# Patient Record
Sex: Female | Born: 1980
Health system: Southern US, Community
[De-identification: ages and names within clinical notes are randomized; demographics above are authoritative.]

## PROBLEM LIST (undated history)

## (undated) ENCOUNTER — Inpatient Hospital Stay (HOSPITAL_COMMUNITY): Payer: Self-pay

## (undated) DIAGNOSIS — M1990 Unspecified osteoarthritis, unspecified site: Secondary | ICD-10-CM

## (undated) DIAGNOSIS — Z5189 Encounter for other specified aftercare: Secondary | ICD-10-CM

## (undated) DIAGNOSIS — D649 Anemia, unspecified: Secondary | ICD-10-CM

## (undated) DIAGNOSIS — J302 Other seasonal allergic rhinitis: Secondary | ICD-10-CM

## (undated) HISTORY — PX: DILATION AND CURETTAGE OF UTERUS: SHX78

## (undated) HISTORY — DX: Unspecified osteoarthritis, unspecified site: M19.90

## (undated) HISTORY — DX: Encounter for other specified aftercare: Z51.89

## (undated) HISTORY — PX: DILATION AND EVACUATION: SHX1459

---

## 1999-10-07 ENCOUNTER — Emergency Department (HOSPITAL_COMMUNITY): Admission: EM | Admit: 1999-10-07 | Discharge: 1999-10-07 | Payer: Self-pay

## 2000-07-26 ENCOUNTER — Other Ambulatory Visit: Admission: RE | Admit: 2000-07-26 | Discharge: 2000-07-26 | Payer: Self-pay | Admitting: Internal Medicine

## 2001-05-11 ENCOUNTER — Ambulatory Visit (HOSPITAL_COMMUNITY): Admission: RE | Admit: 2001-05-11 | Discharge: 2001-05-11 | Payer: Self-pay

## 2008-03-04 ENCOUNTER — Emergency Department: Payer: Self-pay | Admitting: Emergency Medicine

## 2008-08-13 ENCOUNTER — Inpatient Hospital Stay (HOSPITAL_COMMUNITY): Admission: AD | Admit: 2008-08-13 | Discharge: 2008-08-15 | Payer: Self-pay | Admitting: Obstetrics and Gynecology

## 2009-08-18 ENCOUNTER — Encounter: Admission: RE | Admit: 2009-08-18 | Discharge: 2009-08-18 | Payer: Self-pay | Admitting: Family Medicine

## 2010-04-18 LAB — CBC
HCT: 31.5 % — ABNORMAL LOW (ref 36.0–46.0)
MCHC: 34.5 g/dL (ref 30.0–36.0)
MCV: 91.3 fL (ref 78.0–100.0)
MCV: 92.2 fL (ref 78.0–100.0)
Platelets: 186 10*3/uL (ref 150–400)
RBC: 3.18 MIL/uL — ABNORMAL LOW (ref 3.87–5.11)
RDW: 14 % (ref 11.5–15.5)
RDW: 14.1 % (ref 11.5–15.5)
WBC: 13.4 10*3/uL — ABNORMAL HIGH (ref 4.0–10.5)

## 2010-04-18 LAB — RPR: RPR Ser Ql: NONREACTIVE

## 2012-03-26 ENCOUNTER — Encounter (HOSPITAL_COMMUNITY): Payer: Self-pay | Admitting: *Deleted

## 2012-03-26 ENCOUNTER — Inpatient Hospital Stay (HOSPITAL_COMMUNITY)
Admission: AD | Admit: 2012-03-26 | Discharge: 2012-03-26 | Disposition: A | Discharge status: Home / Self Care | Payer: 59 | Source: Ambulatory Visit | Attending: Obstetrics & Gynecology | Admitting: Obstetrics & Gynecology

## 2012-03-26 DIAGNOSIS — O209 Hemorrhage in early pregnancy, unspecified: Secondary | ICD-10-CM

## 2012-03-26 DIAGNOSIS — O200 Threatened abortion: Secondary | ICD-10-CM | POA: Insufficient documentation

## 2012-03-26 HISTORY — DX: Anemia, unspecified: D64.9

## 2012-03-26 LAB — URINALYSIS, ROUTINE W REFLEX MICROSCOPIC
Bilirubin Urine: NEGATIVE
Glucose, UA: NEGATIVE mg/dL
Ketones, ur: NEGATIVE mg/dL
Leukocytes, UA: NEGATIVE
Nitrite: NEGATIVE
Protein, ur: NEGATIVE mg/dL
Specific Gravity, Urine: 1.025 (ref 1.005–1.030)
Urobilinogen, UA: 0.2 mg/dL (ref 0.0–1.0)
pH: 7 (ref 5.0–8.0)

## 2012-03-26 LAB — URINE MICROSCOPIC-ADD ON

## 2012-03-26 NOTE — MAU Note (Signed)
Pt presents with complaints of blood in her urine this morning and is just concerned. Denies pain states her stomach just feels weird at the bottom of her stomach

## 2012-03-26 NOTE — MAU Provider Note (Signed)
  History     CSN: [id number]  Arrival date and time: 03/26/12 1031   First Provider Initiated Contact with Patient 03/26/12 1100      Chief Complaint  Patient presents with  . Hematuria   HPI 32 yo, 8.6/7 wks by LMP, here for vaginal bleeding- large spot on tissue and a small clot this morning and some brown/pink spotting yesterday. 1st C/s in 2008, then VBAC in 2010. PNCare not started yet, but plans to start at [facility]/ Dr Billy Coast. O(+) per office records.    ROS Physical Exam   Blood pressure 132/68, pulse 80, temperature 98 F (36.7 C), height 5\' 6"  (1.676 m), weight 284 lb (128.822 kg), last menstrual period 01/24/2012.  Physical Exam A&O x 3, no acute distress. Pleasant HEENT neg Lungs CTA bilat CV RRR, S1S2 normal Abdo soft, non tender, non acute Extr no edema/ tenderness Pelvic Cervix closed, long. No CMT FHT Present on bedside sono (abdominal and vaginal sono)  MAU Course  Procedures  Bedside sono - abdo and vaginal -Intrauterine sac and fetal pole, yolk sac noted, FCA noted, normal in 160s.   Assessment and Plan  Threatened abortion. 8.6/7 by LMP. O(+) Reassured, Discharge home  Naveen Lorusso R 03/26/2012, 11:16 AM

## 2012-03-26 NOTE — MAU Note (Signed)
Pt. Here due to  bleeding this am after wiping. None noted in toilet after voiding. No pain associated with bleeding.

## 2012-06-07 ENCOUNTER — Encounter (HOSPITAL_COMMUNITY): Payer: Self-pay | Admitting: *Deleted

## 2012-06-07 ENCOUNTER — Other Ambulatory Visit: Payer: Self-pay | Admitting: Obstetrics and Gynecology

## 2012-06-07 NOTE — H&P (Signed)
NAME:  Melinda Chan, Melinda Chan NO.:  [id number]  MEDICAL RECORD NO.:  [id number]  LOCATION:  [facility]                         FACILITY:  [facility]  PHYSICIAN:  Lenoard Aden, M.D.DATE OF BIRTH:  05-13-1980  DATE OF ADMISSION:  06/07/2012 DATE OF DISCHARGE:                             HISTORY & PHYSICAL   CHIEF COMPLAINT:  IUFD at 18 weeks.  HISTORY OF PRESENT ILLNESS:  She is a 32 year old African American female, G4, P2, history of C-section x1, vaginal delivery x1, spontaneous abortion x1 who presents now with an IUFD unexplained at 18 weeks.  MEDICATIONS:  Prenatal vitamins and Tylenol as needed.  SOCIAL HISTORY:  She is a nonsmoker, nondrinker.  She denies domestic or physical violence.  She has a history of vaginal delivery x1, C-sections x1.  SURGICAL HISTORY:  Otherwise noncontributory.  PHYSICAL EXAMINATION:  GENERAL:  Well-developed well-nourished African American female in no acute distress. HEENT:  Normal. NECK:  Supple.  Full range of motion. LUNGS:  Clear. HEART:  Regular rhythm. ABDOMEN:  Soft, gravid, nontender.  Uterus 18 weeks' size.  No adnexal masses appreciated.  IMPRESSION:  Intrauterine fetal demise unexplained at 18 weeks.  PLAN:  Late trimester D and E.  Risks of anesthesia, infection, bleeding, injury to abdominal organs discussed.  Delayed versus immediate complications to include bowel and bladder injury noted.  The patient acknowledges and wishes to proceed.  Due to the patient's history of anemia, we will type and screen.     Lenoard Aden, M.D.     RJT/MEDQ  D:  06/07/2012  T:  06/07/2012  Job:  [id number]

## 2012-06-08 ENCOUNTER — Ambulatory Visit (HOSPITAL_COMMUNITY): Payer: 59

## 2012-06-08 ENCOUNTER — Encounter (HOSPITAL_COMMUNITY): Payer: Self-pay | Admitting: Anesthesiology

## 2012-06-08 ENCOUNTER — Ambulatory Visit (HOSPITAL_COMMUNITY)
Admission: AD | Admit: 2012-06-08 | Discharge: 2012-06-08 | Disposition: A | Discharge status: Home / Self Care | Payer: 59 | Source: Ambulatory Visit | Attending: Obstetrics and Gynecology | Admitting: Obstetrics and Gynecology

## 2012-06-08 ENCOUNTER — Encounter (HOSPITAL_COMMUNITY): Payer: Self-pay | Admitting: *Deleted

## 2012-06-08 ENCOUNTER — Other Ambulatory Visit: Payer: Self-pay | Admitting: Obstetrics and Gynecology

## 2012-06-08 ENCOUNTER — Ambulatory Visit (HOSPITAL_COMMUNITY): Payer: 59 | Admitting: Anesthesiology

## 2012-06-08 ENCOUNTER — Encounter (HOSPITAL_COMMUNITY): Payer: Self-pay | Admitting: Pharmacy Technician

## 2012-06-08 ENCOUNTER — Encounter (HOSPITAL_COMMUNITY)
Admission: AD | Disposition: A | Discharge status: Home / Self Care | Payer: Self-pay | Source: Ambulatory Visit | Attending: Obstetrics and Gynecology

## 2012-06-08 DIAGNOSIS — D6489 Other specified anemias: Secondary | ICD-10-CM | POA: Insufficient documentation

## 2012-06-08 DIAGNOSIS — O021 Missed abortion: Secondary | ICD-10-CM | POA: Insufficient documentation

## 2012-06-08 DIAGNOSIS — O364XX0 Maternal care for intrauterine death, not applicable or unspecified: Secondary | ICD-10-CM

## 2012-06-08 HISTORY — DX: Other seasonal allergic rhinitis: J30.2

## 2012-06-08 HISTORY — PX: DILATION AND EVACUATION: SHX1459

## 2012-06-08 LAB — CBC
HCT: 31.7 % — ABNORMAL LOW (ref 36.0–46.0)
Hemoglobin: 10.4 g/dL — ABNORMAL LOW (ref 12.0–15.0)
MCH: 28 pg (ref 26.0–34.0)
Platelets: 252 10*3/uL (ref 150–400)
RDW: 17.2 % — ABNORMAL HIGH (ref 11.5–15.5)

## 2012-06-08 LAB — TYPE AND SCREEN: Antibody Screen: NEGATIVE

## 2012-06-08 SURGERY — DILATION AND EVACUATION, UTERUS, SECOND TRIMESTER
Anesthesia: Spinal | Site: Uterus | Wound class: Clean Contaminated

## 2012-06-08 MED ORDER — FENTANYL CITRATE 0.05 MG/ML IJ SOLN
INTRAMUSCULAR | Status: DC | PRN
  Administered 2012-06-08 (×4): 25 ug via INTRAVENOUS

## 2012-06-08 MED ORDER — DEXTROSE 5 % IV SOLN
INTRAVENOUS | Status: AC
  Filled 2012-06-08: qty 3000

## 2012-06-08 MED ORDER — METHYLERGONOVINE MALEATE 0.2 MG/ML IJ SOLN
INTRAMUSCULAR | Status: AC
  Filled 2012-06-08: qty 1

## 2012-06-08 MED ORDER — METHYLERGONOVINE MALEATE 0.2 MG/ML IJ SOLN
INTRAMUSCULAR | Status: DC | PRN
  Administered 2012-06-08: 0.2 mg via INTRAMUSCULAR

## 2012-06-08 MED ORDER — MIDAZOLAM HCL 2 MG/2ML IJ SOLN
INTRAMUSCULAR | Status: AC
  Filled 2012-06-08: qty 2

## 2012-06-08 MED ORDER — MIDAZOLAM HCL 5 MG/5ML IJ SOLN
INTRAMUSCULAR | Status: DC | PRN
  Administered 2012-06-08 (×2): 2 mg via INTRAVENOUS

## 2012-06-08 MED ORDER — BUPIVACAINE HCL (PF) 0.25 % IJ SOLN
INTRAMUSCULAR | Status: AC
  Filled 2012-06-08: qty 30

## 2012-06-08 MED ORDER — LACTATED RINGERS IV SOLN
INTRAVENOUS | Status: DC
  Administered 2012-06-08 (×2): via INTRAVENOUS

## 2012-06-08 MED ORDER — ONDANSETRON HCL 4 MG/2ML IJ SOLN
INTRAMUSCULAR | Status: AC
  Filled 2012-06-08: qty 2

## 2012-06-08 MED ORDER — KETOROLAC TROMETHAMINE 30 MG/ML IJ SOLN: 15.0000 mg | Freq: Once | INTRAMUSCULAR | Status: DC | PRN

## 2012-06-08 MED ORDER — OXYCODONE-ACETAMINOPHEN 5-325 MG PO TABS: 1.0000 | ORAL_TABLET | ORAL | Status: DC | PRN

## 2012-06-08 MED ORDER — CEFAZOLIN SODIUM-DEXTROSE 2-3 GM-% IV SOLR
INTRAVENOUS | Status: AC
  Filled 2012-06-08: qty 50

## 2012-06-08 MED ORDER — FENTANYL CITRATE 0.05 MG/ML IJ SOLN
INTRAMUSCULAR | Status: AC
  Filled 2012-06-08: qty 2

## 2012-06-08 MED ORDER — FENTANYL CITRATE 0.05 MG/ML IJ SOLN: 25.0000 ug | INTRAMUSCULAR | Status: DC | PRN

## 2012-06-08 MED ORDER — ONDANSETRON HCL 4 MG/2ML IJ SOLN
INTRAMUSCULAR | Status: DC | PRN
  Administered 2012-06-08: 4 mg via INTRAVENOUS

## 2012-06-08 MED ORDER — CEFAZOLIN SODIUM-DEXTROSE 2-3 GM-% IV SOLR
2.0000 g | INTRAVENOUS | Status: AC
  Administered 2012-06-08: 3 g via INTRAVENOUS

## 2012-06-08 MED ORDER — ZOLPIDEM TARTRATE ER 6.25 MG PO TBCR: 6.2500 mg | EXTENDED_RELEASE_TABLET | Freq: Every evening | ORAL | Status: DC | PRN

## 2012-06-08 SURGICAL SUPPLY — 19 items
CLOTH BEACON ORANGE TIMEOUT ST (SAFETY) ×2 IMPLANT
CONT PATH 16OZ SNAP LID 3702 (MISCELLANEOUS) IMPLANT
DRAPE HYSTEROSCOPY (DRAPE) ×2 IMPLANT
GLOVE BIO SURGEON STRL SZ7.5 (GLOVE) ×2 IMPLANT
GOWN PREVENTION PLUS XLARGE (GOWN DISPOSABLE) ×2 IMPLANT
GOWN STRL REIN XL XLG (GOWN DISPOSABLE) ×4 IMPLANT
KIT BERKELEY 2ND TRIMESTER 1/2 (COLLECTOR) ×2 IMPLANT
NDL SPNL 22GX3.5 QUINCKE BK (NEEDLE) ×1 IMPLANT
NEEDLE SPNL 22GX3.5 QUINCKE BK (NEEDLE) ×2 IMPLANT
NS IRRIG 1000ML POUR BTL (IV SOLUTION) ×2 IMPLANT
PACK VAGINAL MINOR WOMEN LF (CUSTOM PROCEDURE TRAY) ×2 IMPLANT
PAD OB MATERNITY 4.3X12.25 (PERSONAL CARE ITEMS) ×2 IMPLANT
PAD PREP 24X48 CUFFED NSTRL (MISCELLANEOUS) ×2 IMPLANT
SYR CONTROL 10ML LL (SYRINGE) ×2 IMPLANT
TOWEL OR 17X24 6PK STRL BLUE (TOWEL DISPOSABLE) ×4 IMPLANT
TUBE VACURETTE 2ND TRIMESTER (CANNULA) ×2 IMPLANT
VACURETTE 12 RIGID CVD (CANNULA) IMPLANT
VACURETTE 14MM CVD 1/2 BASE (CANNULA) IMPLANT
VACURETTE 16MM ASPIR CVD .5 (CANNULA) IMPLANT

## 2012-06-08 NOTE — Progress Notes (Signed)
Patient ID: Melinda Chan, female   DOB: 06-30-1980, 32 y.o.   MRN: [id number] Patient seen and examined. Consent witnessed and signed. No changes noted. Update completed.

## 2012-06-08 NOTE — Anesthesia Postprocedure Evaluation (Signed)
  Anesthesia Post-op Note   Anesthesia Post Note  Patient: Melinda Chan  Procedure(s) Performed: Procedure(s) (LRB): Ultrasound Guided; DILATATION AND EVACUATION (D&E) 2ND TRIMESTER (N/A)  Anesthesia type: Spinal  Patient location: PACU  Post pain: Pain level controlled  Post assessment: Post-op Vital signs reviewed  Last Vitals:  Filed Vitals:   06/08/12 1445  BP: 124/69  Pulse: 93  Temp:   Resp: 18    Post vital signs: Reviewed  Level of consciousness: awake  Complications: No apparent anesthesia complications

## 2012-06-08 NOTE — Transfer of Care (Signed)
Immediate Anesthesia Transfer of Care Note  Patient: Melinda Chan  Procedure(s) Performed: Procedure(s) with comments: Ultrasound Guided; DILATATION AND EVACUATION (D&E) 2ND TRIMESTER (N/A) - 45 min.  Patient Location: PACU  Anesthesia Type:Spinal  Level of Consciousness: awake  Airway & Oxygen Therapy: Patient Spontanous Breathing  Post-op Assessment: Report given to PACU RN and Post -op Vital signs reviewed and stable  Post vital signs: stable  Complications: No apparent anesthesia complications

## 2012-06-08 NOTE — Op Note (Signed)
06/08/2012  1:07 PM  PATIENT:  Melinda Chan  32 y.o. female  PRE-OPERATIVE DIAGNOSIS:  18 wk fetal demise  -unexplained Anemia  POST-OPERATIVE DIAGNOSIS:  18 wk fetal demise    PROCEDURE:  Procedure(s): Ultrasound Guided; DILATATION AND EVACUATION (D&E) 2ND TRIMESTER  SURGEON:  Surgeon(s): Lenoard Aden, MD  ASSISTANTS: none   ANESTHESIA:   spinal  ESTIMATED BLOOD LOSS:  DRAINS: none   LOCAL MEDICATIONS USED:  NONE  SPECIMEN:  Source of Specimen:  poc  DISPOSITION OF SPECIMEN:  PATHOLOGY  COUNTS:  YES  DICTATION #: [id number]  PLAN OF CARE: DC home  PATIENT DISPOSITION:  PACU - hemodynamically stable.

## 2012-06-08 NOTE — Anesthesia Procedure Notes (Signed)
Spinal  Patient location during procedure: OR Preanesthetic Checklist Completed: patient identified, site marked, surgical consent, pre-op evaluation, timeout performed, IV checked, risks and benefits discussed and monitors and equipment checked Spinal Block Patient position: sitting Prep: DuraPrep Patient monitoring: heart rate, cardiac monitor, continuous pulse ox and blood pressure Approach: midline Location: L3-4 Injection technique: single-shot Needle Needle type: Sprotte  Needle gauge: 24 G Needle length: 9 cm Assessment Sensory level: T8 Additional Notes Spinal Dosage in OR  Xylocaine 5%    ml       1.3

## 2012-06-08 NOTE — Anesthesia Preprocedure Evaluation (Addendum)
Anesthesia Evaluation  Patient identified by MRN, date of birth, ID band Patient awake    Reviewed: Allergy & Precautions, H&P , NPO status , Patient's Chart, lab work & pertinent test results, reviewed documented beta blocker date and time   History of Anesthesia Complications Negative for: history of anesthetic complications  Airway Mallampati: III TM Distance: >3 FB Neck ROM: full    Dental  (+) Teeth Intact   Pulmonary neg pulmonary ROS,  breath sounds clear to auscultation        Cardiovascular negative cardio ROS  Rhythm:regular Rate:Normal + Systolic murmurs    Neuro/Psych negative neurological ROS  negative psych ROS   GI/Hepatic negative GI ROS, Neg liver ROS,   Endo/Other  Morbid obesity  Renal/GU negative Renal ROS     Musculoskeletal   Abdominal   Peds  Hematology  (+) anemia ,   Anesthesia Other Findings   Reproductive/Obstetrics (+) Pregnancy (18 week fetal demise)                           Anesthesia Physical Anesthesia Plan  ASA: III  Anesthesia Plan: Spinal and MAC   Post-op Pain Management:    Induction:   Airway Management Planned:   Additional Equipment:   Intra-op Plan:   Post-operative Plan:   Informed Consent: I have reviewed the patients History and Physical, chart, labs and discussed the procedure including the risks, benefits and alternatives for the proposed anesthesia with the patient or authorized representative who has indicated his/her understanding and acceptance.     Plan Discussed with: Surgeon and CRNA  Anesthesia Plan Comments:        Anesthesia Quick Evaluation

## 2012-06-09 ENCOUNTER — Encounter (HOSPITAL_COMMUNITY): Payer: Self-pay | Admitting: Obstetrics and Gynecology

## 2012-06-09 NOTE — Op Note (Signed)
NAME:  Melinda Chan, Melinda Chan NO.:  [id number]  MEDICAL RECORD NO.:  [id number]  LOCATION:  [facility]                          FACILITY:  [facility]  PHYSICIAN:  Lenoard Aden, M.D.DATE OF BIRTH:  1980-12-28  DATE OF PROCEDURE: DATE OF DISCHARGE:  06/08/2012                              OPERATIVE REPORT   DESCRIPTION OF PROCEDURE:  After being apprised of risks of anesthesia, infection, bleeding, possible injury to abdominal organs, possible need for repair, possible risk of uterine perforation with possible need for exploratory laparotomy and repair, the patient was brought to the operating room.  After consents were signed, she was prepped and draped in usual sterile fashion, catheterized until the bladder was empty. Exam under anesthesia revealed a 16-18 week sized uterus.  No adnexal masses.  Laminaria, two 4 x 4 gauze sponges were used.  Under ultrasound guidance, cervix easily dilated up to a #15 Pratt dilator and 14 mm suction curette was placed for amniotomy.  Sopher forceps used for extraction of 18 week fetus under ultrasound guidance.  Complete extraction with multiple passes was noted.  Good hemostasis was noted. Methergine was given.  Repeat suction and curettage in a 4-quadrant method.  Blunt curettage reveals cavity to be empty.  All instruments were removed.  Tissue was collected and sent for an aneuploidy probe. The patient was awakened and transferred to recovery in good condition.     Lenoard Aden, M.D.     RJT/MEDQ  D:  06/08/2012  T:  06/09/2012  Job:  [id number]

## 2012-06-21 LAB — CHROMOSOME STD, POC(TISSUE)-NCBH

## 2012-06-29 LAB — CBC
MCHC: 32.1 g/dL (ref 32.0–36.0)
MCV: 86 fL (ref 80–100)
Platelet: 245 10*3/uL (ref 150–440)
RBC: 2.73 10*6/uL — ABNORMAL LOW (ref 3.80–5.20)
RDW: 15 % — ABNORMAL HIGH (ref 11.5–14.5)
WBC: 11.4 10*3/uL — ABNORMAL HIGH (ref 3.6–11.0)

## 2012-06-29 LAB — COMPREHENSIVE METABOLIC PANEL
Albumin: 2.7 g/dL — ABNORMAL LOW (ref 3.4–5.0)
Alkaline Phosphatase: 61 U/L (ref 50–136)
Anion Gap: 5 — ABNORMAL LOW (ref 7–16)
Bilirubin,Total: 0.3 mg/dL (ref 0.2–1.0)
EGFR (Non-African Amer.): >60
Glucose: 82 mg/dL (ref 65–99)
Osmolality: 273 (ref 275–301)
SGOT(AST): 14 U/L — ABNORMAL LOW (ref 15–37)
SGPT (ALT): 16 U/L (ref 12–78)

## 2012-06-29 LAB — HCG, QUANTITATIVE, PREGNANCY: Beta Hcg, Quant.: 184 m[IU]/mL — ABNORMAL HIGH

## 2012-06-30 ENCOUNTER — Observation Stay: Payer: Self-pay | Admitting: Obstetrics & Gynecology

## 2012-07-03 LAB — PATHOLOGY REPORT

## 2013-01-29 ENCOUNTER — Encounter (HOSPITAL_COMMUNITY): Payer: Self-pay | Admitting: *Deleted

## 2013-07-19 ENCOUNTER — Encounter: Payer: Self-pay | Admitting: Family Medicine

## 2013-07-19 ENCOUNTER — Ambulatory Visit (INDEPENDENT_AMBULATORY_CARE_PROVIDER_SITE_OTHER): Payer: 59 | Admitting: Family Medicine

## 2013-07-19 VITALS — BP 114/60 | HR 82 | Temp 98.3°F | Ht 65.25 in | Wt 304.5 lb

## 2013-07-19 DIAGNOSIS — Z136 Encounter for screening for cardiovascular disorders: Secondary | ICD-10-CM

## 2013-07-19 DIAGNOSIS — Z8349 Family history of other endocrine, nutritional and metabolic diseases: Secondary | ICD-10-CM

## 2013-07-19 DIAGNOSIS — Z0000 Encounter for general adult medical examination without abnormal findings: Secondary | ICD-10-CM | POA: Insufficient documentation

## 2013-07-19 DIAGNOSIS — Z833 Family history of diabetes mellitus: Secondary | ICD-10-CM

## 2013-07-19 DIAGNOSIS — D509 Iron deficiency anemia, unspecified: Secondary | ICD-10-CM

## 2013-07-19 DIAGNOSIS — D649 Anemia, unspecified: Secondary | ICD-10-CM | POA: Insufficient documentation

## 2013-07-19 LAB — CBC WITH DIFFERENTIAL/PLATELET
BASOS ABS: 0 10*3/uL (ref 0.0–0.1)
Basophils Relative: 0.4 % (ref 0.0–3.0)
EOS ABS: 0.1 10*3/uL (ref 0.0–0.7)
Eosinophils Relative: 1.3 % (ref 0.0–5.0)
HCT: 31.4 % — ABNORMAL LOW (ref 36.0–46.0)
Hemoglobin: 10.2 g/dL — ABNORMAL LOW (ref 12.0–15.0)
LYMPHS PCT: 27.2 % (ref 12.0–46.0)
Lymphs Abs: 2.5 10*3/uL (ref 0.7–4.0)
MCHC: 32.5 g/dL (ref 30.0–36.0)
MCV: 83.2 fl (ref 78.0–100.0)
MONOS PCT: 9.2 % (ref 3.0–12.0)
Monocytes Absolute: 0.8 10*3/uL (ref 0.1–1.0)
NEUTROS PCT: 61.9 % (ref 43.0–77.0)
Neutro Abs: 5.7 10*3/uL (ref 1.4–7.7)
PLATELETS: 289 10*3/uL (ref 150.0–400.0)
RBC: 3.77 Mil/uL — ABNORMAL LOW (ref 3.87–5.11)
RDW: 15.7 % — AB (ref 11.5–15.5)
WBC: 9.1 10*3/uL (ref 4.0–10.5)

## 2013-07-19 LAB — COMPREHENSIVE METABOLIC PANEL
ALBUMIN: 3.4 g/dL — AB (ref 3.5–5.2)
ALK PHOS: 52 U/L (ref 39–117)
ALT: 15 U/L (ref 0–35)
AST: 23 U/L (ref 0–37)
BILIRUBIN TOTAL: 0.5 mg/dL (ref 0.2–1.2)
BUN: 10 mg/dL (ref 6–23)
CO2: 26 mEq/L (ref 19–32)
Calcium: 9.3 mg/dL (ref 8.4–10.5)
Chloride: 104 mEq/L (ref 96–112)
Creatinine, Ser: 0.8 mg/dL (ref 0.4–1.2)
GFR: 106.23 mL/min (ref >60.00)
Glucose, Bld: 95 mg/dL (ref 70–99)
POTASSIUM: 4.2 meq/L (ref 3.5–5.1)
SODIUM: 135 meq/L (ref 135–145)
TOTAL PROTEIN: 7.5 g/dL (ref 6.0–8.3)

## 2013-07-19 LAB — HEMOGLOBIN A1C: Hgb A1c MFr Bld: 4.7 % (ref 4.6–6.5)

## 2013-07-19 LAB — T4, FREE: FREE T4: 0.93 ng/dL (ref 0.60–1.60)

## 2013-07-19 LAB — LDL CHOLESTEROL, DIRECT: LDL DIRECT: 87.6 mg/dL

## 2013-07-19 LAB — TSH: TSH: 0.43 u[IU]/mL (ref 0.35–4.50)

## 2013-07-19 NOTE — Progress Notes (Signed)
Subjective:   Patient ID: Melinda Chan, female    DOB: 1980-06-27, 33 y.o.   MRN: [id number]  Melinda Chan is a pleasant 33 y.o. year old female who presents to clinic today with Establish Care  on 07/19/2013  HPI: G3P3 here to establish care.  Sees Dr. Billy Coast- pap smear UTD- 05/2013. Unfortunately experienced a fetal demise last year.  Feels she is coping ok. Husband very supportive.    Left foot edema- chronic issue.  Improved when feet have been elevated.  In the morning, both feet are same size. This has been a life long issue.  Obesity- has never weighed this much.  Wants to try diet/exercise/life style changes before she tries anything else.  Anemia- followed by OGBYN since her last pregnancy.  Taking iron.  Current Outpatient Prescriptions on File Prior to Visit  Medication Sig Dispense Refill  . ferrous sulfate 325 (65 FE) MG tablet Take 325 mg by mouth daily with breakfast.       No current facility-administered medications on file prior to visit.    No Known Allergies  Past Medical History  Diagnosis Date  . Anemia   . SVD (spontaneous vaginal delivery)     x 1  . Seasonal allergies     Past Surgical History  Procedure Laterality Date  . Cesarean section  2008  . Dilation and evacuation N/A 06/08/2012    Procedure: Ultrasound Guided; DILATATION AND EVACUATION (D&E) 2ND TRIMESTER;  Surgeon: Lenoard Aden, MD;  Location: [facility];  Service: Gynecology;  Laterality: N/A;  45 min.    Family History  Problem Relation Age of Onset  . Hypertension Mother   . Hyperlipidemia Mother   . Heart disease Father   . Arthritis Maternal Grandmother   . Stroke Maternal Grandmother   . Diabetes Maternal Grandfather   . Cancer Paternal Grandfather     History   Social History  . Marital Status: Married    Spouse Name: N/A    Number of Children: N/A  . Years of Education: N/A   Occupational History  . Not on file.   Social History Main Topics  .  Smoking status: Never Smoker   . Smokeless tobacco: Never Used  . Alcohol Use: No  . Drug Use: No  . Sexual Activity: Yes    Birth Control/ Protection: None     Comment: 18 ks gestation   Other Topics Concern  . Not on file   Social History Narrative  . No narrative on file   The PMH, PSH, Social History, Family History, Medications, and allergies have been reviewed in [facility], and have been updated if relevant.   Review of Systems See HPI Patient reports no  vision/ hearing changes,anorexia, weight change, fever ,adenopathy, persistant / recurrent hoarseness, swallowing issues, chest pain, edema,persistant / recurrent cough, hemoptysis, dyspnea(rest, exertional, paroxysmal nocturnal), gastrointestinal  bleeding (melena, rectal bleeding), abdominal pain, excessive heart burn, GU symptoms(dysuria, hematuria, pyuria, voiding/incontinence  Issues) syncope, focal weakness, severe memory loss, concerning skin lesions, depression, anxiety, abnormal bruising/bleeding, major joint swelling, breast masses or abnormal vaginal bleeding.       Objective:    BP 114/60  Pulse 82  Temp(Src) 98.3 F (36.8 C) (Oral)  Ht 5' 5.25" (1.657 m)  Wt 304 lb 8 oz (138.12 kg)  BMI 50.31 kg/m2  SpO2 98%  LMP 06/23/2013  Breastfeeding? No   Physical Exam   General:  Obese,well-nourished,in no acute distress; alert,appropriate and cooperative throughout examination Head:  normocephalic and  atraumatic.   Eyes:  vision grossly intact, pupils equal, pupils round, and pupils reactive to light.   Ears:  R ear normal and L ear normal.   Nose:  no external deformity.   Mouth:  good dentition.   Neck:  No deformities, masses, or tenderness noted.  Lungs:  Normal respiratory effort, chest expands symmetrically. Lungs are clear to auscultation, no crackles or wheezes. Heart:  Normal rate and regular rhythm. S1 and S2 normal without gallop, murmur, click, rub or other extra sounds. Abdomen:  Bowel sounds  positive,abdomen soft and non-tender without masses, organomegaly or hernias noted. Msk:  No deformity or scoliosis noted of thoracic or lumbar spine.   Extremities:   Left nonpitting pedal edema Neurologic:  alert & oriented X3 and gait normal.   Skin:  Intact without suspicious lesions or rashes Cervical Nodes:  No lymphadenopathy noted Axillary Nodes:  No palpable lymphadenopathy Psych:  Cognition and judgment appear intact. Alert and cooperative with normal attention span and concentration. No apparent delusions, illusions, hallucinations       Assessment & Plan:   Routine general medical examination at a health care facility - Plan: Comprehensive metabolic panel  Iron deficiency anemia - Plan: CBC with Differential  Screening for ischemic heart disease - Plan: LDL Cholesterol, Direct  Family history of diabetes mellitus (DM) - Plan: Hemoglobin A1c  Family history of thyroid disease - Plan: TSH, T4, Free  Morbid obesity No Follow-up on file.

## 2013-07-19 NOTE — Assessment & Plan Note (Signed)
Declining nutrition referral at this time.

## 2013-07-19 NOTE — Progress Notes (Signed)
Pre visit review using our clinic review tool, if applicable. No additional management support is needed unless otherwise documented below in the visit note. 

## 2013-07-19 NOTE — Assessment & Plan Note (Signed)
Reviewed preventive care protocols, scheduled due services, and updated immunizations Discussed nutrition, exercise, diet, and healthy lifestyle.  

## 2013-07-19 NOTE — Patient Instructions (Signed)
Great to meet you.  We will call you with your lab results and you can view them online.  

## 2013-07-19 NOTE — Assessment & Plan Note (Signed)
Check cbc today. Continue iron.

## 2013-07-20 ENCOUNTER — Encounter: Payer: Self-pay | Admitting: *Deleted

## 2013-11-12 ENCOUNTER — Encounter: Payer: Self-pay | Admitting: Family Medicine

## 2013-12-21 ENCOUNTER — Other Ambulatory Visit (HOSPITAL_COMMUNITY): Payer: Self-pay | Admitting: Obstetrics and Gynecology

## 2013-12-21 DIAGNOSIS — IMO0002 Reserved for concepts with insufficient information to code with codable children: Secondary | ICD-10-CM

## 2013-12-21 DIAGNOSIS — Z0489 Encounter for examination and observation for other specified reasons: Secondary | ICD-10-CM

## 2013-12-21 DIAGNOSIS — O43102 Malformation of placenta, unspecified, second trimester: Secondary | ICD-10-CM

## 2013-12-26 ENCOUNTER — Encounter (HOSPITAL_COMMUNITY): Payer: Self-pay

## 2013-12-26 ENCOUNTER — Other Ambulatory Visit (HOSPITAL_COMMUNITY): Payer: Self-pay | Admitting: Obstetrics and Gynecology

## 2013-12-26 ENCOUNTER — Ambulatory Visit (HOSPITAL_COMMUNITY)
Admission: RE | Admit: 2013-12-26 | Discharge: 2013-12-26 | Disposition: A | Discharge status: Home / Self Care | Payer: 59 | Source: Ambulatory Visit | Attending: Obstetrics and Gynecology | Admitting: Obstetrics and Gynecology

## 2013-12-26 DIAGNOSIS — O09522 Supervision of elderly multigravida, second trimester: Secondary | ICD-10-CM | POA: Diagnosis not present

## 2013-12-26 DIAGNOSIS — Z3A25 25 weeks gestation of pregnancy: Secondary | ICD-10-CM | POA: Diagnosis not present

## 2013-12-26 DIAGNOSIS — O43102 Malformation of placenta, unspecified, second trimester: Secondary | ICD-10-CM

## 2013-12-26 DIAGNOSIS — Z3689 Encounter for other specified antenatal screening: Secondary | ICD-10-CM | POA: Insufficient documentation

## 2013-12-26 DIAGNOSIS — Z0489 Encounter for examination and observation for other specified reasons: Secondary | ICD-10-CM

## 2013-12-26 DIAGNOSIS — O99212 Obesity complicating pregnancy, second trimester: Secondary | ICD-10-CM | POA: Insufficient documentation

## 2013-12-26 DIAGNOSIS — O3421 Maternal care for scar from previous cesarean delivery: Secondary | ICD-10-CM | POA: Insufficient documentation

## 2013-12-26 DIAGNOSIS — IMO0002 Reserved for concepts with insufficient information to code with codable children: Secondary | ICD-10-CM

## 2013-12-27 ENCOUNTER — Other Ambulatory Visit (HOSPITAL_COMMUNITY): Payer: Self-pay

## 2014-01-11 NOTE — L&D Delivery Note (Signed)
Delivery Note At 11:38 AM a viable and healthy female was delivered via Vaginal, Spontaneous Delivery (Presentation: Right Occiput Anterior).  APGAR: 7, 9; weight  pending.   Placenta status: Intact, manual without complications, inspected, intact Pathology.  Cord: 3 vessels with the following complications: None.  Cord pH: na  Anesthesia: Epidural  Episiotomy: None Lacerations: 2nd degree;Perineal and right periurethral Suture Repair: 2.0 3.0 vicryl rapide Est. Blood Loss (mL): 300 Friable vaginal tissue noted. No packing needed.  Mom to postpartum.  Baby to Couplet care / Skin to Skin.  Ayslin Kundert J 03/14/2014, 12:16 PM

## 2014-01-25 ENCOUNTER — Ambulatory Visit (HOSPITAL_COMMUNITY): Payer: 59

## 2014-03-14 ENCOUNTER — Inpatient Hospital Stay (HOSPITAL_COMMUNITY): Admission: AD | Admit: 2014-03-14 | Payer: Self-pay | Source: Ambulatory Visit | Admitting: Obstetrics and Gynecology

## 2014-03-14 ENCOUNTER — Encounter (HOSPITAL_COMMUNITY): Payer: Self-pay | Admitting: *Deleted

## 2014-03-14 ENCOUNTER — Inpatient Hospital Stay (HOSPITAL_COMMUNITY)
Admission: AD | Admit: 2014-03-14 | Discharge: 2014-03-16 | DRG: 775 | Disposition: A | Discharge status: Home / Self Care | Payer: 59 | Source: Ambulatory Visit | Attending: Obstetrics and Gynecology | Admitting: Obstetrics and Gynecology

## 2014-03-14 ENCOUNTER — Inpatient Hospital Stay (HOSPITAL_COMMUNITY): Payer: 59 | Admitting: Anesthesiology

## 2014-03-14 DIAGNOSIS — O3421 Maternal care for scar from previous cesarean delivery: Secondary | ICD-10-CM | POA: Diagnosis present

## 2014-03-14 DIAGNOSIS — Z3490 Encounter for supervision of normal pregnancy, unspecified, unspecified trimester: Secondary | ICD-10-CM

## 2014-03-14 DIAGNOSIS — D62 Acute posthemorrhagic anemia: Secondary | ICD-10-CM | POA: Diagnosis not present

## 2014-03-14 DIAGNOSIS — O9081 Anemia of the puerperium: Secondary | ICD-10-CM | POA: Diagnosis not present

## 2014-03-14 DIAGNOSIS — O09523 Supervision of elderly multigravida, third trimester: Secondary | ICD-10-CM | POA: Diagnosis present

## 2014-03-14 DIAGNOSIS — Z3A36 36 weeks gestation of pregnancy: Secondary | ICD-10-CM | POA: Diagnosis present

## 2014-03-14 LAB — CBC
HCT: 33.8 % — ABNORMAL LOW (ref 36.0–46.0)
Hemoglobin: 10.9 g/dL — ABNORMAL LOW (ref 12.0–15.0)
MCH: 28.8 pg (ref 26.0–34.0)
MCHC: 32.2 g/dL (ref 30.0–36.0)
MCV: 89.4 fL (ref 78.0–100.0)
PLATELETS: 179 10*3/uL (ref 150–400)
RBC: 3.78 MIL/uL — ABNORMAL LOW (ref 3.87–5.11)
RDW: 14.4 % (ref 11.5–15.5)
WBC: 9.3 10*3/uL (ref 4.0–10.5)

## 2014-03-14 LAB — PREPARE RBC (CROSSMATCH)

## 2014-03-14 MED ORDER — DIPHENHYDRAMINE HCL 50 MG/ML IJ SOLN: 12.5000 mg | INTRAMUSCULAR | Status: DC | PRN

## 2014-03-14 MED ORDER — FENTANYL 2.5 MCG/ML BUPIVACAINE 1/10 % EPIDURAL INFUSION (WH - ANES)
14.0000 mL/h | INTRAMUSCULAR | Status: DC | PRN
  Filled 2014-03-14: qty 125

## 2014-03-14 MED ORDER — SIMETHICONE 80 MG PO CHEW
80.0000 mg | CHEWABLE_TABLET | ORAL | Status: DC | PRN
Start: 2014-03-14 — End: 2014-03-16

## 2014-03-14 MED ORDER — SENNOSIDES-DOCUSATE SODIUM 8.6-50 MG PO TABS
2.0000 | ORAL_TABLET | ORAL | Status: DC
  Administered 2014-03-14: 2 via ORAL
  Filled 2014-03-14 (×2): qty 2

## 2014-03-14 MED ORDER — ONDANSETRON HCL 4 MG/2ML IJ SOLN: 4.0000 mg | Freq: Four times a day (QID) | INTRAMUSCULAR | Status: DC | PRN

## 2014-03-14 MED ORDER — OXYCODONE-ACETAMINOPHEN 5-325 MG PO TABS: 2.0000 | ORAL_TABLET | ORAL | Status: DC | PRN

## 2014-03-14 MED ORDER — EPHEDRINE 5 MG/ML INJ: 10.0000 mg | INTRAVENOUS | Status: DC | PRN

## 2014-03-14 MED ORDER — LACTATED RINGERS IV SOLN: 500.0000 mL | Freq: Once | INTRAVENOUS | Status: DC

## 2014-03-14 MED ORDER — WITCH HAZEL-GLYCERIN EX PADS: 1.0000 "application " | MEDICATED_PAD | CUTANEOUS | Status: DC | PRN

## 2014-03-14 MED ORDER — MISOPROSTOL 200 MCG PO TABS: 800.0000 ug | ORAL_TABLET | Freq: Once | ORAL | Status: DC

## 2014-03-14 MED ORDER — CITRIC ACID-SODIUM CITRATE 334-500 MG/5ML PO SOLN: 30.0000 mL | ORAL | Status: DC | PRN

## 2014-03-14 MED ORDER — ONDANSETRON HCL 4 MG PO TABS: 4.0000 mg | ORAL_TABLET | ORAL | Status: DC | PRN

## 2014-03-14 MED ORDER — LIDOCAINE HCL (PF) 1 % IJ SOLN: 30.0000 mL | INTRAMUSCULAR | Status: DC | PRN

## 2014-03-14 MED ORDER — OXYCODONE-ACETAMINOPHEN 5-325 MG PO TABS
2.0000 | ORAL_TABLET | ORAL | Status: DC | PRN
Start: 2014-03-14 — End: 2014-03-14

## 2014-03-14 MED ORDER — LIDOCAINE HCL (PF) 1 % IJ SOLN
INTRAMUSCULAR | Status: DC | PRN
  Administered 2014-03-14 (×2): 4 mL

## 2014-03-14 MED ORDER — DIPHENHYDRAMINE HCL 25 MG PO CAPS
25.0000 mg | ORAL_CAPSULE | Freq: Four times a day (QID) | ORAL | Status: DC | PRN
Start: 2014-03-14 — End: 2014-03-16

## 2014-03-14 MED ORDER — OXYCODONE-ACETAMINOPHEN 5-325 MG PO TABS: 1.0000 | ORAL_TABLET | ORAL | Status: DC | PRN

## 2014-03-14 MED ORDER — FLEET ENEMA 7-19 GM/118ML RE ENEM: 1.0000 | ENEMA | RECTAL | Status: DC | PRN

## 2014-03-14 MED ORDER — OXYTOCIN BOLUS FROM INFUSION: 500.0000 mL | INTRAVENOUS | Status: DC

## 2014-03-14 MED ORDER — PHENYLEPHRINE 40 MCG/ML (10ML) SYRINGE FOR IV PUSH (FOR BLOOD PRESSURE SUPPORT)
80.0000 ug | PREFILLED_SYRINGE | INTRAVENOUS | Status: DC | PRN
Start: 2014-03-14 — End: 2014-03-14
  Filled 2014-03-14: qty 20

## 2014-03-14 MED ORDER — DIBUCAINE 1 % RE OINT: 1.0000 "application " | TOPICAL_OINTMENT | RECTAL | Status: DC | PRN

## 2014-03-14 MED ORDER — FENTANYL 2.5 MCG/ML BUPIVACAINE 1/10 % EPIDURAL INFUSION (WH - ANES)
INTRAMUSCULAR | Status: DC | PRN
  Administered 2014-03-14: 14 mL/h via EPIDURAL

## 2014-03-14 MED ORDER — LACTATED RINGERS IV SOLN
INTRAVENOUS | Status: DC
  Administered 2014-03-14: 11:00:00 via INTRAVENOUS

## 2014-03-14 MED ORDER — ONDANSETRON HCL 4 MG/2ML IJ SOLN: 4.0000 mg | INTRAMUSCULAR | Status: DC | PRN

## 2014-03-14 MED ORDER — LACTATED RINGERS IV SOLN
500.0000 mL | INTRAVENOUS | Status: DC | PRN
  Administered 2014-03-14: 1000 mL via INTRAVENOUS

## 2014-03-14 MED ORDER — PRENATAL MULTIVITAMIN CH
1.0000 | ORAL_TABLET | Freq: Every day | ORAL | Status: DC
  Administered 2014-03-15 – 2014-03-16 (×2): 1 via ORAL
  Filled 2014-03-14 (×2): qty 1

## 2014-03-14 MED ORDER — PHENYLEPHRINE 40 MCG/ML (10ML) SYRINGE FOR IV PUSH (FOR BLOOD PRESSURE SUPPORT): 80.0000 ug | PREFILLED_SYRINGE | INTRAVENOUS | Status: DC | PRN

## 2014-03-14 MED ORDER — IBUPROFEN 600 MG PO TABS
600.0000 mg | ORAL_TABLET | Freq: Four times a day (QID) | ORAL | Status: DC
  Administered 2014-03-14 – 2014-03-16 (×9): 600 mg via ORAL
  Filled 2014-03-14 (×9): qty 1

## 2014-03-14 MED ORDER — ACETAMINOPHEN 325 MG PO TABS: 650.0000 mg | ORAL_TABLET | ORAL | Status: DC | PRN

## 2014-03-14 MED ORDER — MISOPROSTOL 200 MCG PO TABS
ORAL_TABLET | ORAL | Status: AC
  Filled 2014-03-14: qty 4

## 2014-03-14 MED ORDER — SODIUM CHLORIDE 0.9 % IV SOLN: Freq: Once | INTRAVENOUS | Status: DC

## 2014-03-14 MED ORDER — OXYTOCIN 40 UNITS IN LACTATED RINGERS INFUSION - SIMPLE MED
62.5000 mL/h | INTRAVENOUS | Status: DC
  Administered 2014-03-14: 999 mL/h via INTRAVENOUS
  Filled 2014-03-14: qty 1000

## 2014-03-14 MED ORDER — TETANUS-DIPHTH-ACELL PERTUSSIS 5-2.5-18.5 LF-MCG/0.5 IM SUSP
0.5000 mL | Freq: Once | INTRAMUSCULAR | Status: AC
  Administered 2014-03-16: 0.5 mL via INTRAMUSCULAR
  Filled 2014-03-14: qty 0.5

## 2014-03-14 MED ORDER — LANOLIN HYDROUS EX OINT: TOPICAL_OINTMENT | CUTANEOUS | Status: DC | PRN

## 2014-03-14 MED ORDER — METHYLERGONOVINE MALEATE 0.2 MG PO TABS: 0.2000 mg | ORAL_TABLET | ORAL | Status: DC | PRN

## 2014-03-14 MED ORDER — ZOLPIDEM TARTRATE 5 MG PO TABS: 5.0000 mg | ORAL_TABLET | Freq: Every evening | ORAL | Status: DC | PRN

## 2014-03-14 MED ORDER — METHYLERGONOVINE MALEATE 0.2 MG/ML IJ SOLN: 0.2000 mg | INTRAMUSCULAR | Status: DC | PRN

## 2014-03-14 MED ORDER — BENZOCAINE-MENTHOL 20-0.5 % EX AERO
1.0000 "application " | INHALATION_SPRAY | CUTANEOUS | Status: DC | PRN
  Administered 2014-03-15: 1 via TOPICAL
  Filled 2014-03-14: qty 56

## 2014-03-14 MED ORDER — FERROUS SULFATE 325 (65 FE) MG PO TABS
325.0000 mg | ORAL_TABLET | Freq: Every day | ORAL | Status: DC
  Administered 2014-03-15 – 2014-03-16 (×2): 325 mg via ORAL
  Filled 2014-03-14 (×2): qty 1

## 2014-03-14 NOTE — Progress Notes (Signed)
Pt with a small slow steady stream of bleeding from vagina.  EBL 100cc.  FF@U .  Dr Billy Coastaavon updated on phone regarding status.  Wiliam Ke Bailey CNM on unit.  Will come to assess.

## 2014-03-14 NOTE — Lactation Note (Signed)
This note was copied from the chart of Melinda Chan. Lactation Consultation Note  Patient Name: Melinda Chan [id number] Date: 03/14/2014 Reason for consult: Initial assessment  Baby is 17 hours old and breast fed after delivery and has been sleeping since.  LC checked the diaper ( and noted to be dry ) baby woke up and LC placed him STS with mom, LC reviewed with mom hand expressing , several drops of colostrum noted. Baby  Opened wide  And latched with depth , flanged lips . Swallows noted , increased with breast compressions. Baby released after 4 mins , and mom burped him. [place] assisted with re- latch same breast and fed 8 mins ,  increased swallows noted. Baby released on his own. LC discussed with mom potential feeding behaviors with a  31 4/7 day old baby , even though weight is 6-9.9 oz at birth. The need to wake him up at 3 hours to feed .  Prior to latch - breast massage , hand express, and latch with breast compressions until the baby is in a consistent swallowing pattern. Also the LPT feeding policy and the need to set up a DEBP. Presently mom has visitors so [place] placed a DEBP in the room with kit and the  [facility] RN aware it will be need to be set up this evening. LPT hand out given to mom. per mom plans to call [employer] for her insurance pump , LC explained to mom due to the baby being a LPT probably will have to  consider renting insurance pump in the mean time. Mom receptive to teaching. Mother informed of post-discharge support and given phone number to the lactation department, including services for phone call assistance;  out-patient appointments; and breastfeeding support group. List of other breastfeeding resources in the community given in the handout. Encouraged  mother to call for problems or concerns related to breastfeeding.  Maternal Data Has patient been taught Hand Expression?: Yes Does the patient have breastfeeding experience prior to this  delivery?: Yes  Feeding Feeding Type: Breast Fed Length of feed: 8 min  LATCH Score/Interventions Latch: Grasps breast easily, tongue down, lips flanged, rhythmical sucking.  Audible Swallowing: A few with stimulation  Type of Nipple: Everted at rest and after stimulation  Comfort (Breast/Nipple): Soft / non-tender     Hold (Positioning): Assistance needed to correctly position infant at breast and maintain latch. Intervention(s): Breastfeeding basics reviewed;Support Pillows;Position options;Skin to skin  LATCH Score: 8  Lactation Tools Discussed/Used Pump Review: Milk Storage   Consult Status Consult Status: Follow-up Date: 03/15/14 Follow-up type: In-patient    Myer Haff 03/14/2014, 4:56 PM

## 2014-03-14 NOTE — Anesthesia Preprocedure Evaluation (Signed)
Anesthesia Evaluation  Patient identified by MRN, date of birth, ID band Patient awake    Reviewed: Allergy & Precautions, NPO status , Patient's Chart, lab work & pertinent test results  History of Anesthesia Complications Negative for: history of anesthetic complications  Airway Mallampati: III  TM Distance: >3 FB Neck ROM: Full    Dental no notable dental hx. (+) Dental Advisory Given   Pulmonary neg pulmonary ROS,  breath sounds clear to auscultation  Pulmonary exam normal       Cardiovascular negative cardio ROS  Rhythm:Regular Rate:Normal     Neuro/Psych negative neurological ROS  negative psych ROS   GI/Hepatic negative GI ROS, Neg liver ROS,   Endo/Other  Morbid obesity  Renal/GU negative Renal ROS  negative genitourinary   Musculoskeletal negative musculoskeletal ROS (+)   Abdominal   Peds negative pediatric ROS (+)  Hematology  (+) anemia ,   Anesthesia Other Findings   Reproductive/Obstetrics (+) Pregnancy                             Anesthesia Physical Anesthesia Plan  ASA: III  Anesthesia Plan: Epidural   Post-op Pain Management:    Induction:   Airway Management Planned:   Additional Equipment:   Intra-op Plan:   Post-operative Plan:   Informed Consent: I have reviewed the patients History and Physical, chart, labs and discussed the procedure including the risks, benefits and alternatives for the proposed anesthesia with the patient or authorized representative who has indicated his/her understanding and acceptance.   Dental advisory given  Plan Discussed with: Anesthesiologist and CRNA  Anesthesia Plan Comments:         Anesthesia Quick Evaluation

## 2014-03-14 NOTE — Anesthesia Postprocedure Evaluation (Signed)
  Anesthesia Post-op Note  Patient: Melinda Chan  Procedure(s) Performed: * No procedures listed *  Patient Location: PACU and Mother/Baby  Anesthesia Type:Epidural  Level of Consciousness: awake, alert  and oriented  Airway and Oxygen Therapy: Patient Spontanous Breathing  Post-op Pain: mild  Post-op Assessment: Post-op Vital signs reviewed, Patient's Cardiovascular Status Stable, Respiratory Function Stable, No signs of Nausea or vomiting, Adequate PO intake, Pain level controlled, No headache, No backache, No residual numbness and No residual motor weakness  Post-op Vital Signs: Reviewed and stable  Last Vitals:  Filed Vitals:   03/14/14 1445  BP: 131/71  Pulse: 106  Temp: 36.8 C  Resp: 18    Complications: No apparent anesthesia complications

## 2014-03-14 NOTE — Anesthesia Procedure Notes (Signed)
Epidural Patient location during procedure: OB Start time: 03/14/2014 10:40 AM  Staffing Anesthesiologist: Felipe DroneJUDD, Toluwanimi Radebaugh JENNETTE Performed by: anesthesiologist   Preanesthetic Checklist Completed: patient identified, site marked, surgical consent, pre-op evaluation, timeout performed, IV checked, risks and benefits discussed and monitors and equipment checked  Epidural Patient position: sitting Prep: site prepped and draped and DuraPrep Patient monitoring: continuous pulse ox and blood pressure Approach: midline Location: L3-L4 Injection technique: LOR saline  Needle:  Needle type: Tuohy  Needle gauge: 17 G Needle length: 9 cm and 9 Needle insertion depth: 9 cm Catheter type: closed end flexible Catheter size: 19 Gauge Catheter at skin depth: 15 cm Test dose: negative  Assessment Events: blood not aspirated, injection not painful, no injection resistance, negative IV test and no paresthesia  Additional Notes Patient identified. Risks/Benefits/Options discussed with patient including but not limited to bleeding, infection, nerve damage, paralysis, failed block, incomplete pain control, headache, blood pressure changes, nausea, vomiting, reactions to medication both or allergic, itching and postpartum back pain. Confirmed with bedside nurse the patient's most recent platelet count. Confirmed with patient that they are not currently taking any anticoagulation, have any bleeding history or any family history of bleeding disorders. Patient expressed understanding and wished to proceed. All questions were answered. Sterile technique was used throughout the entire procedure. Please see nursing notes for vital signs. Test dose was given through epidural catheter and negative prior to continuing to dose epidural or start infusion. Warning signs of high block given to the patient including shortness of breath, tingling/numbness in hands, complete motor block, or any concerning symptoms with  instructions to call for help. Patient was given instructions on fall risk and not to get out of bed. All questions and concerns addressed with instructions to call with any issues or inadequate analgesia.

## 2014-03-14 NOTE — Progress Notes (Signed)
Melinda Chan is a 34 y.o. G3P2002 at 1835w4d by LMP admitted for active labor  Subjective: Getting more  comfortable  Objective: BP 163/101 mmHg  Pulse 95  Resp 20  Ht 5\' 7"  (1.702 m)  Wt 146.058 kg (322 lb)  BMI 50.42 kg/m2  SpO2 97%  LMP 06/22/2013      FHT:  FHR: 135 bpm, variability: moderate,  accelerations:  Present,  decelerations:  Present occ variable UC:   regular, every 2 minutes SVE:   Dilation: 10 Effacement (%): 100 Station: 0 Exam by:: Dr Billy Coastaavon AROM- clear  Labs: Lab Results  Component Value Date   WBC 9.3 03/14/2014   HGB 10.9* 03/14/2014   HCT 33.8* 03/14/2014   MCV 89.4 03/14/2014   PLT 179 03/14/2014    Assessment / Plan: Spontaneous labor, progressing normally  Previous csection with successful VBAC Placental mass- seen by MFM Placenta to path  Labor: Progressing normally Preeclampsia:  no signs or symptoms of toxicity Fetal Wellbeing:  Category I Pain Control:  Epidural I/D:  n/a Anticipated MOD:  NSVD  [name] J 03/14/2014, 11:00 AM

## 2014-03-14 NOTE — Progress Notes (Signed)
4.0 Vicryl 4.0 stitch placed per Wiliam Ke Bailey CNM. Will leave epidural in place in case of further bleeding problems noted.

## 2014-03-14 NOTE — H&P (Signed)
Clement HusbandsSinquetta Kershaw is a 34 y.o. female presenting for labor evaluation.  Maternal Medical History:  Reason for admission: Contractions.   Contractions: Onset was 1-2 hours ago.   Frequency: regular.   Perceived severity is strong.    Fetal activity: Perceived fetal activity is normal.   Last perceived fetal movement was within the past hour.    Prenatal complications: no prenatal complications Prenatal Complications - Diabetes: none.    OB History    Gravida Para Term Preterm AB TAB SAB Ectopic Multiple Living   4 2 2  0 0 0 0 0 0 2     Past Medical History  Diagnosis Date  . Anemia   . SVD (spontaneous vaginal delivery)     x 1  . Seasonal allergies    Past Surgical History  Procedure Laterality Date  . Cesarean section  2008  . Dilation and evacuation N/A 06/08/2012    Procedure: Ultrasound Guided; DILATATION AND EVACUATION (D&E) 2ND TRIMESTER;  Surgeon: Lenoard Adenichard J Cyenna Rebello, MD;  Location: [facility];  Service: Gynecology;  Laterality: N/A;  45 min.   Family History: family history includes Arthritis in her maternal grandmother; Cancer in her paternal grandfather; Diabetes in her maternal grandfather; Heart disease in her father; Hyperlipidemia in her mother; Hypertension in her mother; Stroke in her maternal grandmother. Social History:  reports that she has never smoked. She has never used smokeless tobacco. She reports that she does not drink alcohol or use illicit drugs.   Prenatal Transfer Tool  Maternal Diabetes: No Genetic Screening: Normal Maternal Ultrasounds/Referrals: Abnormal:  Findings:   Other: retroplacental mass Fetal Ultrasounds or other Referrals:  Referred to Materal Fetal Medicine  Maternal Substance Abuse:  No Significant Maternal Medications:  None Significant Maternal Lab Results:  None Other Comments:  None  Review of Systems  Constitutional: Negative.   All other systems reviewed and are negative.   Dilation: 8 Effacement (%): 100 Station:  -2 Exam by:: Dr Billy Coastaavon Blood pressure 149/77, pulse 93, resp. rate 18, height 5\' 7"  (1.702 m), weight 146.058 kg (322 lb), last menstrual period 06/22/2013. Maternal Exam:  Uterine Assessment: Contraction strength is firm.  Contraction frequency is regular.   Abdomen: Patient reports no abdominal tenderness. Surgical scars: low transverse.   Introitus: Normal vulva. Normal vagina.  Ferning test: negative.  Nitrazine test: negative. Amniotic fluid character: not assessed.  Pelvis: adequate for delivery.   Cervix: Cervix evaluated by digital exam.     Physical Exam  Constitutional: She is oriented to person, place, and time. She appears well-developed and well-nourished.  HENT:  Head: Normocephalic and atraumatic.  Eyes: Pupils are equal, round, and reactive to light.  Neck: Normal range of motion. Neck supple.  Cardiovascular: Normal rate and regular rhythm.   Respiratory: Effort normal and breath sounds normal.  GI: Soft. Bowel sounds are normal.  Genitourinary: Vagina normal and uterus normal.  Musculoskeletal: Normal range of motion.  Neurological: She is alert and oriented to person, place, and time. She has normal reflexes.  Skin: Skin is warm and dry.  Psychiatric: She has a normal mood and affect.    Prenatal labs: ABO, Rh:   Antibody:   Rubella:   RPR:    HBsAg:    HIV:    GBS:     Assessment/Plan: Active labor Previous csection with successful VBAC History of transfusion after D&E Retroplacental mass Admit T&S Epidural prn   Nezzie Manera J 03/14/2014, 10:05 AM

## 2014-03-14 NOTE — Anesthesia Postprocedure Evaluation (Signed)
  Anesthesia Post-op Note  Patient: Melinda Chan  Procedure(s) Performed: * No procedures listed *  Patient Location: PACU and Mother/Baby  Anesthesia Type:Epidural  Level of Consciousness: awake, alert  and oriented  Airway and Oxygen Therapy: Patient Spontanous Breathing  Post-op Pain: mild  Post-op Assessment: Post-op Vital signs reviewed, Patient's Cardiovascular Status Stable, Respiratory Function Stable, No signs of Nausea or vomiting, Adequate PO intake, Pain level controlled, No headache, No backache, No residual numbness and No residual motor weakness  Post-op Vital Signs: Reviewed and stable  Last Vitals:  Filed Vitals:   03/14/14 1445  BP: 131/71  Pulse: 106  Temp: 36.8 C  Resp: 18    Complications: No apparent anesthesia complications 

## 2014-03-14 NOTE — Progress Notes (Signed)
Epidural remains in place after recovery bleeding noted.  Anesthesia notified.

## 2014-03-14 NOTE — Progress Notes (Signed)
  Request to see for Dr Billy Coastaavon (in OR)  SVD with 2nd degree perineal and right periurethral laceration and repair under epidural Nurse call to provider with persistent bright red bleeding - no clots / uterus firm  Assessment: 2nd degree repair intact - no bleeding a site Right periurethral repair intact - with small superficial extension of laceration down labia with bright red bleeding with small pulsing bleed  Bimanual - uterus firm / cervix closed / no clots expressed / LUS firm   4-0 vicryl single suture in figure-of-eight for hemostasis No bleeding noted at site Residual epidural anesthesia adequate for single suture - tolerated well without pain  Marlinda Mikeanya Tremond Shimabukuro CNM [facility]

## 2014-03-15 LAB — CBC
HEMATOCRIT: 28.4 % — AB (ref 36.0–46.0)
HEMOGLOBIN: 9.4 g/dL — AB (ref 12.0–15.0)
MCH: 29.4 pg (ref 26.0–34.0)
MCHC: 33.1 g/dL (ref 30.0–36.0)
MCV: 88.8 fL (ref 78.0–100.0)
Platelets: 173 10*3/uL (ref 150–400)
RBC: 3.2 MIL/uL — AB (ref 3.87–5.11)
RDW: 14.5 % (ref 11.5–15.5)
WBC: 13.5 10*3/uL — AB (ref 4.0–10.5)

## 2014-03-15 LAB — RPR: RPR: NONREACTIVE

## 2014-03-15 MED ORDER — MAGNESIUM OXIDE 400 (241.3 MG) MG PO TABS
200.0000 mg | ORAL_TABLET | Freq: Every day | ORAL | Status: DC
  Administered 2014-03-15 – 2014-03-16 (×2): 200 mg via ORAL
  Filled 2014-03-15 (×3): qty 0.5

## 2014-03-15 NOTE — Progress Notes (Signed)
PPD # 1, SVD, baby boy  S:  Reports feeling good, slightly sore with movement             Tolerating po/ No nausea or vomiting             Bleeding is light             Pain controlled with Motrin             Up ad lib / ambulatory / voiding QS  Newborn breast feeding  - going well, latching well with audible swallowing/ Circumcision - planning today  + some constipation   Wants Tdap vaccine prior to discharge / declines flu vaccine   O:               VS: BP 133/67 mmHg  Pulse 91  Temp(Src) 98.2 F (36.8 C) (Oral)  Resp 18  Ht 5\' 7"  (1.702 m)  Wt 146.058 kg (322 lb)  BMI 50.42 kg/m2  SpO2 99%  LMP 06/22/2013  Breastfeeding? Unknown   LABS:              Recent Labs  03/14/14 1005 03/15/14 0545  WBC 9.3 13.5*  HGB 10.9* 9.4*  PLT 179 173               Blood type: --/--/O POS (03/03 1005)  Rubella:   Immune                     I&O: Intake/Output      03/03 0701 - 03/04 0700 03/04 0701 - 03/05 0700   Urine (mL/kg/hr) 200    Blood 300    Total Output 500     Net -500                        Physical Exam:             Alert and oriented X3  Lungs: Clear and unlabored  Heart: regular rate and rhythm / no mumurs  Abdomen: soft, non-tender, non-distended              Fundus: firm, non-tender, U-2  Perineum: well approximated 2nd degree laceration and right periurethral laceration - healing well / slightly edematous / no erythema / no ecchymosis  Lochia: light  Extremities: +1 dependent edema L>R, no calf pain or tenderness    A: PPD # 1, SVD  IDA Anemia with compounding ABL Anemia - stable on Ferrous Sulfate   Doing well - stable status  P: Routine post partum orders  D/C IV this AM  Shower today  See lactation today   Begin Magnesium Oxide 200mg  daily   Tdap vaccine prior to discharge  Anticipate discharge home today  Milinda CaveMeredith Guyla Bless, SNM

## 2014-03-16 MED ORDER — MAGNESIUM OXIDE 400 (241.3 MG) MG PO TABS: 200.0000 mg | ORAL_TABLET | Freq: Every day | ORAL | Status: DC

## 2014-03-16 MED ORDER — IBUPROFEN 600 MG PO TABS: 600.0000 mg | ORAL_TABLET | Freq: Four times a day (QID) | ORAL | Status: DC

## 2014-03-16 NOTE — Discharge Instructions (Signed)
Breast Pumping Tips °If you are breastfeeding, there may be times when you cannot feed your baby directly. Returning to work or going on a trip are common examples. Pumping allows you to store breast milk and feed it to your baby later.  °You may not get much milk when you first start to pump. Your breasts should start to make more after a few days. If you pump at the times you usually feed your baby, you may be able to keep making enough milk to feed your baby without also using formula. The more often you pump, the more milk you will produce.  °WHEN SHOULD I PUMP?  °· You can begin to pump soon after delivery. However, some experts recommend waiting about 4 weeks before giving your infant a bottle to make sure breastfeeding is going well.  °· If you plan to return to work, begin pumping a few weeks before. This will help you develop techniques that work best for you. It also lets you build up a supply of breast milk.   °· When you are with your infant, feed on demand and pump after each feeding.   °· When you are away from your infant for several hours, pump for about 15 minutes every 2-3 hours. Pump both breasts at the same time if you can.   °· If your infant has a formula feeding, make sure to pump around the same time.     °· If you drink any alcohol, wait 2 hours before pumping.   °HOW DO I PREPARE TO PUMP? °Your let-down reflex is the natural reaction to stimulation that makes your breast milk flow. It is easier to stimulate this reflex when you are relaxed. Find relaxation techniques that work for you. If you have difficulty with your let-down reflex, try these methods:  °· Smell one of your infant's blankets or an item of clothing.   °· Look at a picture or video of your infant.   °· Sit in a quiet, private space.   °· Massage the breast you plan to pump.   °· Place soothing warmth on the breast.   °· Play relaxing music.   °WHAT ARE SOME GENERAL BREAST PUMPING TIPS? °· Wash your hands before you pump. You  do not need to wash your nipples or breasts. °· There are three ways to pump. °¨ You can use your hand to massage and compress your breast. °¨ You can use a handheld manual pump. °¨ You can use an electric pump.   °· Make sure the suction cup (flange) on the breast pump is the right size. Place the flange directly over the nipple. If it is the wrong size or placed the wrong way, it may be painful and cause nipple damage.   °· If pumping is uncomfortable, apply a small amount of purified or modified lanolin to your nipple and areola. °· If you are using an electric pump, adjust the speed and suction power to be more comfortable. °· If pumping is painful or if you are not getting very much milk, you may need a different type of pump. A lactation consultant can help you determine what type of pump to use.   °· Keep a full water bottle near you at all times. Drinking lots of fluid helps you make more milk.  °· You can store your milk to use later. Pumped breast milk can be stored in a sealable, sterile container or plastic bag. Label all stored breast milk with the date you pumped it. °¨ Milk can stay out at room temperature for up to 8 hours. °¨   You can store your milk in the refrigerator for up to 8 days. °¨ You can store your milk in the freezer for 3 months. Thaw frozen milk using warm water. Do not put it in the microwave. °· Do not smoke. Smoking can lower your milk supply and harm your infant. If you need help quitting, ask your health care provider to recommend a program.   °WHEN SHOULD I CALL MY HEALTH CARE PROVIDER OR A LACTATION CONSULTANT? °· You are having trouble pumping. °· You are concerned that you are not making enough milk. °· You have nipple pain, soreness, or redness. °· You want to use birth control. Birth control pills may lower your milk supply. Talk to your health care provider about your options. °Document Released: 06/17/2009 Document Revised: 01/02/2013 Document Reviewed:  10/20/2012 °Exit[employer]® Patient Information ©2015 [employer]. This information is not intended to replace advice given to you by your health care provider. Make sure you discuss any questions you have with your health care provider. ° °Nutrition for the New Mother  °A new mother needs good health and nutrition so she can have energy to take care of a new baby. Whether a mother breastfeeds or formula feeds the baby, it is important to have a well-balanced diet. Foods from all the food groups should be chosen to meet the new mother's energy needs and to give her the nutrients needed for repair and healing.  °A HEALTHY EATING PLAN °The My [employer] plan for Moms outlines what you should eat to help you and your baby stay healthy. The energy and amount of food you need depends on whether or not you are breastfeeding. If you are breastfeeding you will need more nutrients. If you choose not to breastfeed, your nutrition goal should be to return to a healthy weight. Limiting calories may be needed if you are not breastfeeding.  °HOME CARE INSTRUCTIONS  °· For a personal plan based on your unique needs, see your Registered Dietitian or visit www.mypyramid.gov. °· Eat a variety of foods. The plan below will help guide you. The following chart has a suggested daily meal plan from the [employer] for Moms. °· Eat a variety of fruits and vegetables. °· Eat more dark green and orange vegetables and cooked dried beans. °· Make half your grains whole grains. Choose whole instead of refined grains. °· Choose low-fat or lean meats and poultry. °· Choose low-fat or fat-free dairy products like milk, cheese, or yogurt. °Fruits °· Breastfeeding: 2 cups °· Non-Breastfeeding: 2 cups °· What Counts as a serving? °¨ 1 cup of fruit or juice. °¨ ½ cup dried fruit. °Vegetables °· Breastfeeding: 3 cups °· Non-Breastfeeding: 2 ½ cups °· What Counts as a serving? °¨ 1 cup raw or cooked vegetables. °¨ Juice or 2 cups raw leafy  vegetables. °Grains °· Breastfeeding: 8 oz °· Non-Breastfeeding: 6 oz °· What Counts as a serving? °¨ 1 slice bread. °¨ 1 oz ready-to-eat cereal. °¨ ½ cup cooked pasta, rice, or cereal. °Meat and Beans °· Breastfeeding: 6 ½ oz °· Non-Breastfeeding: 5 ½ oz °· What Counts as a serving? °¨ 1 oz lean meat, poultry, or fish °¨ ¼ cup cooked dry beans °¨ ½ oz nuts or 1 egg °¨ 1 tbs peanut butter °Milk °· Breastfeeding: 3 cups °· Non-Breastfeeding: 3 cups °· What Counts as a serving? °¨ 1 cup milk. °¨ 8 oz yogurt. °¨ 1 ½ oz cheese. °¨ 2 oz processed cheese. °TIPS FOR THE BREASTFEEDING MOM °· Rapid weight   loss is not suggested when you are breastfeeding. By simply breastfeeding, you will be able to lose the weight gained during your pregnancy. Your caregiver can keep track of your weight and tell you if your weight loss is appropriate. °· Be sure to drink fluids. You may notice that you are thirstier than usual. A suggestion is to drink a glass of water or other beverage whenever you breastfeed. °· Avoid alcohol as it can be passed into your breast milk. °· Limit caffeine drinks to no more than 2 to 3 cups per day. °· You may need to keep taking your prenatal vitamin while you are breastfeeding. Talk with your caregiver about taking a vitamin or supplement. °RETURING TO A HEALTHY WEIGHT °· The My [employer] Plan for Moms will help you return to a healthy weight. It will also provide the nutrients you need. °· You may need to limit "empty" calories. These include: °¨ High fat foods like fried foods, fatty meats, fast food, butter, and mayonnaise. °¨ High sugar foods like sodas, jelly, candy, and sweets. °· Be physically active. Include 30 minutes of exercise or more each day. Choose an activity you like such as walking, swimming, biking, or aerobics. Check with your caregiver before you start to exercise. °Document Released: 04/06/2007 Document Revised: 03/22/2011 Document Reviewed: 04/06/2007 °ExitCare® Patient Information  ©2015 [employer]. This information is not intended to replace advice given to you by your health care provider. Make sure you discuss any questions you have with your health care provider. °Postpartum Depression and Baby Blues °The postpartum period begins right after the birth of a baby. During this time, there is often a great amount of joy and excitement. It is also a time of many changes in the life of the parents. Regardless of how many times a mother gives birth, each child brings new challenges and dynamics to the family. It is not unusual to have feelings of excitement along with confusing shifts in moods, emotions, and thoughts. All mothers are at risk of developing postpartum depression or the "baby blues." These mood changes can occur right after giving birth, or they may occur many months after giving birth. The baby blues or postpartum depression can be mild or severe. Additionally, postpartum depression can go away rather quickly, or it can be a long-term condition.  °CAUSES °Raised hormone levels and the rapid drop in those levels are thought to be a main cause of postpartum depression and the baby blues. A number of hormones change during and after pregnancy. Estrogen and progesterone usually decrease right after the delivery of your baby. The levels of thyroid hormone and various cortisol steroids also rapidly drop. Other factors that play a role in these mood changes include major life events and genetics.  °RISK FACTORS °If you have any of the following risks for the baby blues or postpartum depression, know what symptoms to watch out for during the postpartum period. Risk factors that may increase the likelihood of getting the baby blues or postpartum depression include: °· Having a personal or family history of depression.   °· Having depression while being pregnant.   °· Having premenstrual mood issues or mood issues related to oral contraceptives. °· Having a lot of life stress.   °· Having  marital conflict.   °· Lacking a social support network.   °· Having a baby with special needs.   °· Having health problems, such as diabetes.   °SIGNS AND SYMPTOMS °Symptoms of baby blues include: °· Brief changes in mood, such as going   from extreme happiness to sadness. °· Decreased concentration.   °· Difficulty sleeping.   °· Crying spells, tearfulness.   °· Irritability.   °· Anxiety.   °Symptoms of postpartum depression typically begin within the first month after giving birth. These symptoms include: °· Difficulty sleeping or excessive sleepiness.   °· Marked weight loss.   °· Agitation.   °· Feelings of worthlessness.   °· Lack of interest in activity or food.   °Postpartum psychosis is a very serious condition and can be dangerous. Fortunately, it is rare. Displaying any of the following symptoms is cause for immediate medical attention. Symptoms of postpartum psychosis include:  °· Hallucinations and delusions.   °· Bizarre or disorganized behavior.   °· Confusion or disorientation.   °DIAGNOSIS  °A diagnosis is made by an evaluation of your symptoms. There are no medical or lab tests that lead to a diagnosis, but there are various questionnaires that a health care provider may use to identify those with the baby blues, postpartum depression, or psychosis. Often, a screening tool called the Edinburgh Postnatal Depression Scale is used to diagnose depression in the postpartum period.  °TREATMENT °The baby blues usually goes away on its own in 1-2 weeks. Social support is often all that is needed. You will be encouraged to get adequate sleep and rest. Occasionally, you may be given medicines to help you sleep.  °Postpartum depression requires treatment because it can last several months or longer if it is not treated. Treatment may include individual or group therapy, medicine, or both to address any social, physiological, and psychological factors that may play a role in the depression. Regular exercise, a  healthy diet, rest, and social support may also be strongly recommended.  °Postpartum psychosis is more serious and needs treatment right away. Hospitalization is often needed. °HOME CARE INSTRUCTIONS °· Get as much rest as you can. Nap when the baby sleeps.   °· Exercise regularly. Some women find yoga and walking to be beneficial.   °· Eat a balanced and nourishing diet.   °· Do little things that you enjoy. Have a cup of tea, take a bubble bath, read your favorite magazine, or listen to your favorite music. °· Avoid alcohol.   °· Ask for help with household chores, cooking, grocery shopping, or running errands as needed. Do not try to do everything.   °· Talk to people close to you about how you are feeling. Get support from your partner, family members, friends, or other new moms. °· Try to stay positive in how you think. Think about the things you are grateful for.   °· Do not spend a lot of time alone.   °· Only take over-the-counter or prescription medicine as directed by your health care provider. °· Keep all your postpartum appointments.   °· Let your health care provider know if you have any concerns.   °SEEK MEDICAL CARE IF: °You are having a reaction to or problems with your medicine. °SEEK IMMEDIATE MEDICAL CARE IF: °· You have suicidal feelings.   °· You think you may harm the baby or someone else. °MAKE SURE YOU: °· Understand these instructions. °· Will watch your condition. °· Will get help right away if you are not doing well or get worse. °Document Released: 10/02/2003 Document Revised: 01/02/2013 Document Reviewed: 10/09/2012 °Exit[employer]® Patient Information ©2015 [employer]. This information is not intended to replace advice given to you by your health care provider. Make sure you discuss any questions you have with your health care provider. °Breastfeeding and Mastitis °Mastitis is inflammation of the breast tissue. It can occur in women who   are breastfeeding. This can make breastfeeding  painful. Mastitis will sometimes go away on its own. Your health care provider will help determine if treatment is needed. °CAUSES °Mastitis is often associated with a blocked milk (lactiferous) duct. This can happen when too much milk builds up in the breast. Causes of excess milk in the breast can include: °· Poor latch-on. If your baby is not latched onto the breast properly, she or he may not empty your breast completely while breastfeeding. °· Allowing too much time to pass between feedings. °· Wearing a bra or other clothing that is too tight. This puts extra pressure on the lactiferous ducts so milk does not flow through them as it should. °Mastitis can also be caused by a bacterial infection. Bacteria may enter the breast tissue through cuts or openings in the skin. In women who are breastfeeding, this may occur because of cracked or irritated skin. Cracks in the skin are often caused when your baby does not latch on properly to the breast. °SIGNS AND SYMPTOMS °· Swelling, redness, tenderness, and pain in an area of the breast. °· Swelling of the glands under the arm on the same side. °· Fever may or may not accompany mastitis. °If an infection is allowed to progress, a collection of pus (abscess) may develop. °DIAGNOSIS  °Your health care provider can usually diagnose mastitis based on your symptoms and a physical exam. Tests may be done to help confirm the diagnosis. These may include: °· Removal of pus from the breast by applying pressure to the area. This pus can be examined in the lab to determine which bacteria are present. If an abscess has developed, the fluid in the abscess can be removed with a needle. This can also be used to confirm the diagnosis and determine the bacteria present. In most cases, pus will not be present. °· Blood tests to determine if your body is fighting a bacterial infection. °· Mammogram or ultrasound tests to rule out other problems or diseases. °TREATMENT  °Mastitis that  occurs with breastfeeding will sometimes go away on its own. Your health care provider may choose to wait 24 hours after first seeing you to decide whether a prescription medicine is needed. If your symptoms are worse after 24 hours, your health care provider will likely prescribe an antibiotic medicine to treat the mastitis. He or she will determine which bacteria are most likely causing the infection and will then select an appropriate antibiotic medicine. This is sometimes changed based on the results of tests performed to identify the bacteria, or if there is no response to the antibiotic medicine selected. Antibiotic medicines are usually given by mouth. You may also be given medicine for pain. °HOME CARE INSTRUCTIONS °· Only take over-the-counter or prescription medicines for pain, fever, or discomfort as directed by your health care provider. °· If your health care provider prescribed an antibiotic medicine, take the medicine as directed. Make sure you finish it even if you start to feel better. °· Do not wear a tight or underwire bra. Wear a soft, supportive bra. °· Increase your fluid intake, especially if you have a fever. °· Continue to empty the breast. Your health care provider can tell you whether this milk is safe for your infant or needs to be thrown out. You may be told to stop nursing until your health care provider thinks it is safe for your baby. Use a breast pump if you are advised to stop nursing. °· Keep your nipples   clean and dry. °· Empty the first breast completely before going to the other breast. If your baby is not emptying your breasts completely for some reason, use a breast pump to empty your breasts. °· If you go back to work, pump your breasts while at work to stay in time with your nursing schedule. °· Avoid allowing your breasts to become overly filled with milk (engorged). °SEEK MEDICAL CARE IF: °· You have pus-like discharge from the breast. °· Your symptoms do not improve with  the treatment prescribed by your health care provider within 2 days. °SEEK IMMEDIATE MEDICAL CARE IF: °· Your pain and swelling are getting worse. °· You have pain that is not controlled with medicine. °· You have a red line extending from the breast toward your armpit. °· You have a fever or persistent symptoms for more than 2-3 days. °· You have a fever and your symptoms suddenly get worse. °MAKE SURE YOU:  °· Understand these instructions. °· Will watch your condition. °· Will get help right away if you are not doing well or get worse. °Document Released: 04/24/2004 Document Revised: 01/02/2013 Document Reviewed: 08/03/2012 °[employer]® Patient Information ©2015 [employer]. This information is not intended to replace advice given to you by your health care provider. Make sure you discuss any questions you have with your health care provider. °Breastfeeding °Deciding to breastfeed is one of the best choices you can make for you and your baby. A change in hormones during pregnancy causes your breast tissue to grow and increases the number and size of your milk ducts. These hormones also allow proteins, sugars, and fats from your blood supply to make breast milk in your milk-producing glands. Hormones prevent breast milk from being released before your baby is born as well as prompt milk flow after birth. Once breastfeeding has begun, thoughts of your baby, as well as his or her sucking or crying, can stimulate the release of milk from your milk-producing glands.  °BENEFITS OF BREASTFEEDING °For Your Baby °· Your first milk (colostrum) helps your baby's digestive system function better.   °· There are antibodies in your milk that help your baby fight off infections.   °· Your baby has a lower incidence of asthma, allergies, and sudden infant death syndrome.   °· The nutrients in breast milk are better for your baby than infant formulas and are designed uniquely for your baby's needs.   °· Breast milk improves your  baby's brain development.   °· Your baby is less likely to develop other conditions, such as childhood obesity, asthma, or type 2 diabetes mellitus.   °For You  °· Breastfeeding helps to create a very special bond between you and your baby.   °· Breastfeeding is convenient. Breast milk is always available at the correct temperature and costs nothing.   °· Breastfeeding helps to burn calories and helps you lose the weight gained during pregnancy.   °· Breastfeeding makes your uterus contract to its prepregnancy size faster and slows bleeding (lochia) after you give birth.   °· Breastfeeding helps to lower your risk of developing type 2 diabetes mellitus, osteoporosis, and breast or ovarian cancer later in life. °SIGNS THAT YOUR BABY IS HUNGRY °Early Signs of Hunger  °· Increased alertness or activity. °· Stretching. °· Movement of the head from side to side. °· Movement of the head and opening of the mouth when the corner of the mouth or cheek is stroked (rooting). °· Increased sucking sounds, smacking lips, cooing, sighing, or squeaking. °· Hand-to-mouth movements. °· Increased sucking of   fingers or hands. °Late Signs of Hunger °· Fussing. °· Intermittent crying. °Extreme Signs of Hunger °Signs of extreme hunger will require calming and consoling before your baby will be able to breastfeed successfully. Do not wait for the following signs of extreme hunger to occur before you initiate breastfeeding:   °· Restlessness. °· A loud, strong cry. °·  Screaming. °BREASTFEEDING BASICS °Breastfeeding Initiation °· Find a comfortable place to sit or lie down, with your neck and back well supported. °· Place a pillow or rolled up blanket under your baby to bring him or her to the level of your breast (if you are seated). Nursing pillows are specially designed to help support your arms and your baby while you breastfeed. °· Make sure that your baby's abdomen is facing your abdomen.   °· Gently massage your breast. With your  fingertips, massage from your chest wall toward your nipple in a circular motion. This encourages milk flow. You may need to continue this action during the feeding if your milk flows slowly. °· Support your breast with 4 fingers underneath and your thumb above your nipple. Make sure your fingers are well away from your nipple and your baby's mouth.   °· Stroke your baby's lips gently with your finger or nipple.   °· When your baby's mouth is open wide enough, quickly bring your baby to your breast, placing your entire nipple and as much of the colored area around your nipple (areola) as possible into your baby's mouth.   °¨ More areola should be visible above your baby's upper lip than below the lower lip.   °¨ Your baby's tongue should be between his or her lower gum and your breast.   °· Ensure that your baby's mouth is correctly positioned around your nipple (latched). Your baby's lips should create a seal on your breast and be turned out (everted). °· It is common for your baby to suck about 2-3 minutes in order to start the flow of breast milk. °Latching °Teaching your baby how to latch on to your breast properly is very important. An improper latch can cause nipple pain and decreased milk supply for you and poor weight gain in your baby. Also, if your baby is not latched onto your nipple properly, he or she may swallow some air during feeding. This can make your baby fussy. Burping your baby when you switch breasts during the feeding can help to get rid of the air. However, teaching your baby to latch on properly is still the best way to prevent fussiness from swallowing air while breastfeeding. °Signs that your baby has successfully latched on to your nipple:    °· Silent tugging or silent sucking, without causing you pain.   °· Swallowing heard between every 3-4 sucks.   °·  Muscle movement above and in front of his or her ears while sucking.   °Signs that your baby has not successfully latched on to  nipple:  °· Sucking sounds or smacking sounds from your baby while breastfeeding. °· Nipple pain. °If you think your baby has not latched on correctly, slip your finger into the corner of your baby's mouth to break the suction and place it between your baby's gums. Attempt breastfeeding initiation again. °Signs of Successful Breastfeeding °Signs from your baby:   °· A gradual decrease in the number of sucks or complete cessation of sucking.   °· Falling asleep.   °· Relaxation of his or her body.   °· Retention of a small amount of milk in his or her mouth.   °· Letting go   of your breast by himself or herself. °Signs from you: °· Breasts that have increased in firmness, weight, and size 1-3 hours after feeding.   °· Breasts that are softer immediately after breastfeeding. °· Increased milk volume, as well as a change in milk consistency and color by the fifth day of breastfeeding.   °· Nipples that are not sore, cracked, or bleeding. °Signs That Your Baby is Getting Enough Milk °· Wetting at least 3 diapers in a 24-hour period. The urine should be clear and pale yellow by age 5 days. °· At least 3 stools in a 24-hour period by age 5 days. The stool should be soft and yellow. °· At least 3 stools in a 24-hour period by age 7 days. The stool should be seedy and yellow. °· No loss of weight greater than 10% of birth weight during the first 3 days of age. °· Average weight gain of 4-7 ounces (113-198 g) per week after age 4 days. °· Consistent daily weight gain by age 5 days, without weight loss after the age of 2 weeks. °After a feeding, your baby may spit up a small amount. This is common. °BREASTFEEDING FREQUENCY AND DURATION °Frequent feeding will help you make more milk and can prevent sore nipples and breast engorgement. Breastfeed when you feel the need to reduce the fullness of your breasts or when your baby shows signs of hunger. This is called "breastfeeding on demand." Avoid introducing a pacifier to your  baby while you are working to establish breastfeeding (the first 4-6 weeks after your baby is born). After this time you may choose to use a pacifier. Research has shown that pacifier use during the first year of a baby's life decreases the risk of sudden infant death syndrome (SIDS). °Allow your baby to feed on each breast as long as he or she wants. Breastfeed until your baby is finished feeding. When your baby unlatches or falls asleep while feeding from the first breast, offer the second breast. Because newborns are often sleepy in the first few weeks of life, you may need to awaken your baby to get him or her to feed. °Breastfeeding times will vary from baby to baby. However, the following rules can serve as a guide to help you ensure that your baby is properly fed: °· Newborns (babies 4 weeks of age or younger) may breastfeed every 1-3 hours. °· Newborns should not go longer than 3 hours during the day or 5 hours during the night without breastfeeding. °· You should breastfeed your baby a minimum of 8 times in a 24-hour period until you begin to introduce solid foods to your baby at around 6 months of age. °BREAST MILK PUMPING °Pumping and storing breast milk allows you to ensure that your baby is exclusively fed your breast milk, even at times when you are unable to breastfeed. This is especially important if you are going back to work while you are still breastfeeding or when you are not able to be present during feedings. Your lactation consultant can give you guidelines on how long it is safe to store breast milk.  °A breast pump is a machine that allows you to pump milk from your breast into a sterile bottle. The pumped breast milk can then be stored in a refrigerator or freezer. Some breast pumps are operated by hand, while others use electricity. Ask your lactation consultant which type will work best for you. Breast pumps can be purchased, but some hospitals and breastfeeding support groups   lease  breast pumps on a monthly basis. A lactation consultant can teach you how to hand express breast milk, if you prefer not to use a pump.  °CARING FOR YOUR BREASTS WHILE YOU BREASTFEED °Nipples can become dry, cracked, and sore while breastfeeding. The following recommendations can help keep your breasts moisturized and healthy: °· Avoid using soap on your nipples.   °· Wear a supportive bra. Although not required, special nursing bras and tank tops are designed to allow access to your breasts for breastfeeding without taking off your entire bra or top. Avoid wearing underwire-style bras or extremely tight bras. °· Air dry your nipples for 3-4 minutes after each feeding.   °· Use only cotton bra pads to absorb leaked breast milk. Leaking of breast milk between feedings is normal.   °· Use lanolin on your nipples after breastfeeding. Lanolin helps to maintain your skin's normal moisture barrier. If you use pure lanolin, you do not need to wash it off before feeding your baby again. Pure lanolin is not toxic to your baby. You may also hand express a few drops of breast milk and gently massage that milk into your nipples and allow the milk to air dry. °In the first few weeks after giving birth, some women experience extremely full breasts (engorgement). Engorgement can make your breasts feel heavy, warm, and tender to the touch. Engorgement peaks within 3-5 days after you give birth. The following recommendations can help ease engorgement: °· Completely empty your breasts while breastfeeding or pumping. You may want to start by applying warm, moist heat (in the shower or with warm water-soaked hand towels) just before feeding or pumping. This increases circulation and helps the milk flow. If your baby does not completely empty your breasts while breastfeeding, pump any extra milk after he or she is finished. °· Wear a snug bra (nursing or regular) or tank top for 1-2 days to signal your body to slightly decrease milk  production. °· Apply ice packs to your breasts, unless this is too uncomfortable for you. °· Make sure that your baby is latched on and positioned properly while breastfeeding. °If engorgement persists after 48 hours of following these recommendations, contact your health care provider or a lactation consultant. °OVERALL HEALTH CARE RECOMMENDATIONS WHILE BREASTFEEDING °· Eat healthy foods. Alternate between meals and snacks, eating 3 of each per day. Because what you eat affects your breast milk, some of the foods may make your baby more irritable than usual. Avoid eating these foods if you are sure that they are negatively affecting your baby. °· Drink milk, fruit juice, and water to satisfy your thirst (about 10 glasses a day).   °· Rest often, relax, and continue to take your prenatal vitamins to prevent fatigue, stress, and anemia. °· Continue breast self-awareness checks. °· Avoid chewing and smoking tobacco. °· Avoid alcohol and drug use. °Some medicines that may be harmful to your baby can pass through breast milk. It is important to ask your health care provider before taking any medicine, including all over-the-counter and prescription medicine as well as vitamin and herbal supplements. °It is possible to become pregnant while breastfeeding. If birth control is desired, ask your health care provider about options that will be safe for your baby. °SEEK MEDICAL CARE IF:  °· You feel like you want to stop breastfeeding or have become frustrated with breastfeeding. °· You have painful breasts or nipples. °· Your nipples are cracked or bleeding. °· Your breasts are red, tender, or warm. °· You have   a swollen area on either breast. °· You have a fever or chills. °· You have nausea or vomiting. °· You have drainage other than breast milk from your nipples. °· Your breasts do not become full before feedings by the fifth day after you give birth. °· You feel sad and depressed. °· Your baby is too sleepy to eat  well. °· Your baby is having trouble sleeping.   °· Your baby is wetting less than 3 diapers in a 24-hour period. °· Your baby has less than 3 stools in a 24-hour period. °· Your baby's skin or the white part of his or her eyes becomes yellow.   °· Your baby is not gaining weight by 5 days of age. °SEEK IMMEDIATE MEDICAL CARE IF:  °· Your baby is overly tired (lethargic) and does not want to wake up and feed. °· Your baby develops an unexplained fever. °Document Released: 12/28/2004 Document Revised: 01/02/2013 Document Reviewed: 06/21/2012 °Exit[employer]® Patient Information ©2015 [employer]. This information is not intended to replace advice given to you by your health care provider. Make sure you discuss any questions you have with your health care provider. ° °

## 2014-03-16 NOTE — Lactation Note (Signed)
This note was copied from the chart of [name]. Lactation Consultation Note Noted 6% weight loss. LPI 36 4/7 weeks. Baby BF when entered rm. In football position. Mom has large pendulum breast, w/large nipples. Baby has small mouth and appeared not to have a deep latch. Adjusted latch, but that was all I could get into mouth. Hand expression noted easily flowing colostrum. Baby sleepy. Hand expression w/30 ml colostrum pouring out of breast. D/t small mouth and large nipple, I feel that weight loss is d/t baby is LPI, gets tired at breast, snacks then falls to sleep. Also nipple to large and mouth to small to obtain a deeper latch. W/gloved finger gave 30 ml of colostrum in curve tip syring.  Reviewed LPI information again. Encouraged to pump every three hours. Put baby to breast first for BF, then pump and supplement what you pumped. Mom shown how to use DEBP & how to disassemble, clean, & reassemble parts. Mom encouraged to do skin-to-skin. Referred to Baby and Me Book in Breastfeeding section Pg. 22-23 for position options and Proper latch demonstration of a deep and shallow latch.  Patient Name: Boy Melinda Chan RUEAV'WToday's Date: 03/16/2014 Reason for consult: Follow-up assessment;Infant weight loss   Maternal Data    Feeding Feeding Type: Breast Fed Length of feed: 20 min  LATCH Score/Interventions Latch: Grasps breast easily, tongue down, lips flanged, rhythmical sucking.  Audible Swallowing: Spontaneous and intermittent Intervention(s): Alternate breast massage  Type of Nipple: Everted at rest and after stimulation Intervention(s): Double electric pump  Comfort (Breast/Nipple): Soft / non-tender     Hold (Positioning): Assistance needed to correctly position infant at breast and maintain latch. Intervention(s): Skin to skin;Position options;Support Pillows;Breastfeeding basics reviewed  LATCH Score: 9  Lactation Tools Discussed/Used Tools: Pump Breast pump  type: Double-Electric Breast Pump Pump Review: Setup, frequency, and cleaning;Milk Storage Initiated by:: Peri JeffersonL. Greg Cratty RN/RN Date initiated:: 03/15/14   Consult Status Consult Status: Follow-up Date: 03/16/14 (in pm) Follow-up type: In-patient    [name], [name] 03/16/2014, 6:30 AM

## 2014-03-16 NOTE — Discharge Summary (Signed)
Obstetric Discharge Summary  Reason for Admission: Pt is a G3P2101 at 4513w4d admitted in active labor and advanced dilation.  [facility] 9.4 wks, with Dr. Billy Coastaavon as primary provider.  Medications on Admission: Prescriptions prior to admission  Medication Sig Dispense Refill Last Dose  . ferrous sulfate 325 (65 FE) MG tablet Take 325 mg by mouth daily with breakfast.   03/13/2014 at Unknown time  . Prenatal Vit w/Fe-Methylfol-FA (PNV PO) Take 1 tablet by mouth daily.    03/13/2014 at Unknown time    Prenatal Labs: ABO, Rh: O POS (03/03 1005) Antibody: NEG (03/03 1005) Rubella:  Immune RPR: Non Reactive (03/03 1005)  HBsAg:   Non-Reactive HIV:   Non-Reactive GTT : Normal - 101 GBS:   Negative  Prenatal Procedures: ultrasound and NST Intrapartum Course: Admitted in active labor with advanced dilation / epidural for pain management / AROM with clear fluid / immediate progression to complete dilation / SVD of viable female with 2nd degree perineal & RT periurethral repair by Dr. Billy Coastaavon / Arnoldo LenisFriable vaginal tissue not requiring packing or repair / no immediate postpartum complications noted Intrapartum Procedures: spontaneous vaginal delivery Postpartum Procedures: none Complications-Operative and Postpartum: 2nd degree perineal and RT periurethral lacerations  Labs: HEMOGLOBIN  Date Value Ref Range Status  03/15/2014 9.4* 12.0 - 15.0 g/dL Final   HCT  Date Value Ref Range Status  03/15/2014 28.4* 36.0 - 46.0 % Final   Lab Results  Component Value Date   PLT 173 03/15/2014    Newborn Data: Live born female  Birth Weight: 6 lb 9.8 oz (3000 g) APGAR: 7, 9  Home with mother.   Discharge Information: Date: 03/16/2014 Discharge Diagnoses:  Pt is a G3P2101 at 3613w4d S/P Term Pregnancy-delivered on 03/14/2014  Condition: stable Activity: pelvic rest Diet: routine Medications:    Medication List    TAKE these medications        ferrous sulfate  325 (65 FE) MG tablet  Take 325 mg by mouth daily with breakfast.     ibuprofen 600 MG tablet  Commonly known as:  ADVIL,MOTRIN  Take 1 tablet (600 mg total) by mouth every 6 (six) hours.     magnesium oxide 400 (241.3 MG) MG tablet  Commonly known as:  MAG-OX  Take 0.5 tablets (200 mg total) by mouth daily.     PNV PO  Take 1 tablet by mouth daily.       Instructions: [facility] instruction booklet has been given and reviewed Discharge to: home     Follow-up Information    Follow up with Lenoard AdenAAVON,RICHARD J, MD. Schedule an appointment as soon as possible for a visit in 6 weeks.   Specialty:  Obstetrics and Gynecology   Why:  postpartum visit   Contact information:   [address] [place] [id number] [phone]       Raelyn MoraAWSON, Milanni Ayub, Judie PetitM, MSN, CNM 03/16/2014, 9:54 AM

## 2014-03-16 NOTE — Progress Notes (Signed)
PPD # 2, SVD, baby boy  S:  Reports feeling good, slightly sore with movement             Tolerating po/ No nausea or vomiting             Bleeding is light             Pain controlled with Motrin             Up ad lib / ambulatory / voiding QS  Newborn breast feeding - going well, latching well with audible swallowing / Circumcision - done  Constipation improving  Wants Tdap vaccine prior to discharge / declines flu vaccine   O:               VS: BP 119/69 mmHg  Pulse 86  Temp(Src) 98 F (36.7 C) (Oral)  Resp 20  Ht 5\' 7"  (1.702 m)  Wt 146.058  kg (322 lb)  BMI 50.42 kg/m2  SpO2 99%  LMP 06/22/2013  Breastfeeding   LABS:               Recent Labs  03/14/14 1005 03/15/14 0545  WBC 9.3 13.5*  HGB 10.9* 9.4*  PLT 179 173               Blood type: --/--/O POS (03/03 1005)  Rubella:   Immune                                  Physical Exam:             Alert and oriented X3  Lungs: Clear and unlabored  Heart: regular rate and rhythm / no mumurs  Abdomen: soft, non-tender, non-distended              Fundus: firm, non-tender, U-2  Perineum: well approximated 2nd degree laceration and right periurethral laceration - healing well / no edema /  no erythema / no ecchymosis  Lochia: light  Extremities: +1 dependent edema L>R, no calf pain or tenderness    A: PPD # 2, [id number] / SVD with 2nd degree laceration  IDA Anemia with compounding ABL Anemia - stable on Ferrous Sulfate     Doing well - stable   P: Routine post partum orders  Continue Ferrous Sulfate 325 mg daily   Continue Magnesium Oxide 200mg  daily   Tdap vaccine prior to discharge  Anticipate discharge home today  Kenard GowerDAWSON, Janicia Monterrosa, M MSN, CNM 03/16/2014 9:49 AM

## 2014-03-17 LAB — TYPE AND SCREEN
ABO/RH(D): O POS
Antibody Screen: NEGATIVE
UNIT DIVISION: 0
Unit division: 0

## 2014-04-07 ENCOUNTER — Inpatient Hospital Stay (HOSPITAL_COMMUNITY): Admission: AD | Admit: 2014-04-07 | Payer: 59 | Source: Ambulatory Visit | Admitting: Obstetrics and Gynecology

## 2014-05-03 NOTE — Op Note (Signed)
PATIENT NAME:  Melinda Chan, Melinda Chan MR#:  [id number] DATE OF BIRTH:  1980-09-11  DATE OF PROCEDURE:  06/29/2012  PREOPERATIVE DIAGNOSIS: Retained placenta.   POSTOPERATIVE DIAGNOSIS: Retained placenta.  PROCEDURE: Suction dilatation and curettage.   SURGEON: Annamarie MajorPaul Namari Breton, MD  ASSISTANT: None   ANESTHESIA: General.   ESTIMATED BLOOD LOSS: Minimal.   COMPLICATIONS: None.   FINDINGS: Large fragment of placental tissue delivered from the uterus.  It  is approximately 15 to 20 x 5 cm in size.   DISPOSITION: To the recovery room in stable.   TECHNIQUE: The patient is prepped and draped in the usual sterile fashion. After adequate anesthesia is obtained in the dorsal lithotomy position, the bladder is drained with a Robinson catheter. A speculum is placed and a sponge stick is placed in the vagina to remove clots.  It is placed into the cervix and removed a large fragment of placental tissue, approximately 15 x 20 x 5 cm in size.  Curettage is performed of the uterus after it is sounded to 12 cm using a banjo curette.  Aspiration with a 10 mm suction curette is performed with minimal tissue removed.  Repeat curettage and aspiration is performed until excellent hemostasis is noted. The cervix is hemostatic after removal of the tenaculum. The patient goes to the recovery room in stable condition.     All sponge, instrument, and needle counts are correct. Pitocin was added to the IV fluids. The patient is admitted overnight for observation.  ____________________________ R. Annamarie MajorPaul Mycala Warshawsky, MD rph:cb D: 06/29/2012 23:46:25 ET T: 06/29/2012 23:56:39 ET JOB#: [id number]  cc: Dierdre Searles. Paul Arnita Koons, MD, <Dictator> Nadara MustardOBERT P Edsel Shives MD ELECTRONICALLY SIGNED 07/10/2012 6:43

## 2017-02-16 ENCOUNTER — Ambulatory Visit: Payer: 59 | Admitting: Primary Care

## 2017-02-18 ENCOUNTER — Ambulatory Visit (INDEPENDENT_AMBULATORY_CARE_PROVIDER_SITE_OTHER): Payer: BLUE CROSS/BLUE SHIELD | Admitting: Primary Care

## 2017-02-18 ENCOUNTER — Encounter: Payer: Self-pay | Admitting: Primary Care

## 2017-02-18 VITALS — BP 116/78 | HR 75 | Temp 98.1°F | Ht 65.5 in | Wt 306.0 lb

## 2017-02-18 DIAGNOSIS — R002 Palpitations: Secondary | ICD-10-CM | POA: Diagnosis not present

## 2017-02-18 DIAGNOSIS — R42 Dizziness and giddiness: Secondary | ICD-10-CM | POA: Diagnosis not present

## 2017-02-18 DIAGNOSIS — D509 Iron deficiency anemia, unspecified: Secondary | ICD-10-CM

## 2017-02-18 LAB — CBC
HCT: 31 % — ABNORMAL LOW (ref 35.0–45.0)
HEMOGLOBIN: 9.9 g/dL — AB (ref 11.7–15.5)
MCH: 26.8 pg — ABNORMAL LOW (ref 27.0–33.0)
MCHC: 31.9 g/dL — ABNORMAL LOW (ref 32.0–36.0)
MCV: 83.8 fL (ref 80.0–100.0)
MPV: 10.6 fL (ref 7.5–12.5)
Platelets: 311 10*3/uL (ref 140–400)
RBC: 3.7 10*6/uL — ABNORMAL LOW (ref 3.80–5.10)
RDW: 13.4 % (ref 11.0–15.0)
WBC: 7.7 10*3/uL (ref 3.8–10.8)

## 2017-02-18 LAB — COMPREHENSIVE METABOLIC PANEL
ALBUMIN: 3.6 g/dL (ref 3.5–5.2)
ALT: 11 U/L (ref 0–35)
AST: 13 U/L (ref 0–37)
Alkaline Phosphatase: 48 U/L (ref 39–117)
BUN: 9 mg/dL (ref 6–23)
CHLORIDE: 105 meq/L (ref 96–112)
CO2: 30 mEq/L (ref 19–32)
CREATININE: 0.76 mg/dL (ref 0.40–1.20)
Calcium: 8.8 mg/dL (ref 8.4–10.5)
GFR: 108.94 mL/min (ref >60.00)
Glucose, Bld: 84 mg/dL (ref 70–99)
Potassium: 4.3 mEq/L (ref 3.5–5.1)
Sodium: 138 mEq/L (ref 135–145)
TOTAL PROTEIN: 7.4 g/dL (ref 6.0–8.3)
Total Bilirubin: 0.8 mg/dL (ref 0.2–1.2)

## 2017-02-18 LAB — IBC PANEL
Iron: 51 ug/dL (ref 42–145)
SATURATION RATIOS: 14.1 % — AB (ref 20.0–50.0)
Transferrin: 259 mg/dL (ref 212.0–360.0)

## 2017-02-18 LAB — TSH: TSH: 1.32 u[IU]/mL (ref 0.35–4.50)

## 2017-02-18 NOTE — Patient Instructions (Signed)
Stop by the lab prior to leaving today. I will notify you of your results once received.   Please notify me if the palpitations reoccur. Limit use of caffeine. Work on stress reduction.   Please notify me if you continue to experience the dizzy spells, they sound like vertigo.   Please schedule a physical with me anytime at your convenience.  It was a pleasure to meet you today! Please don't hesitate to call or message me with any questions. Welcome to [facility]!   Vertigo Vertigo is the feeling that you or your surroundings are moving when they are not. Vertigo can be dangerous if it occurs while you are doing something that could endanger you or others, such as driving. What are the causes? This condition is caused by a disturbance in the signals that are sent by your body's sensory systems to your brain. Different causes of a disturbance can lead to vertigo, including:  Infections, especially in the inner ear.  A bad reaction to a drug, or misuse of alcohol and medicines.  Withdrawal from drugs or alcohol.  Quickly changing positions, as when lying down or rolling over in bed.  Migraine headaches.  Decreased blood flow to the brain.  Decreased blood pressure.  Increased pressure in the brain from a head or neck injury, stroke, infection, tumor, or bleeding.  Central nervous system disorders.  What are the signs or symptoms? Symptoms of this condition usually occur when you move your head or your eyes in different directions. Symptoms may start suddenly, and they usually last for less than a minute. Symptoms may include:  Loss of balance and falling.  Feeling like you are spinning or moving.  Feeling like your surroundings are spinning or moving.  Nausea and vomiting.  Blurred vision or double vision.  Difficulty hearing.  Slurred speech.  Dizziness.  Involuntary eye movement (nystagmus).  Symptoms can be mild and cause only slight annoyance, or they can be  severe and interfere with daily life. Episodes of vertigo may return (recur) over time, and they are often triggered by certain movements. Symptoms may improve over time. How is this diagnosed? This condition may be diagnosed based on medical history and the quality of your nystagmus. Your health care provider may test your eye movements by asking you to quickly change positions to trigger the nystagmus. This may be called the Dix-Hallpike test, head thrust test, or roll test. You may be referred to a health care provider who specializes in ear, nose, and throat (ENT) problems (otolaryngologist) or a provider who specializes in disorders of the central nervous system (neurologist). You may have additional testing, including:  A physical exam.  Blood tests.  MRI.  A CT scan.  An electrocardiogram (ECG). This records electrical activity in your heart.  An electroencephalogram (EEG). This records electrical activity in your brain.  Hearing tests.  How is this treated? Treatment for this condition depends on the cause and the severity of the symptoms. Treatment options include:  Medicines to treat nausea or vertigo. These are usually used for severe cases. Some medicines that are used to treat other conditions may also reduce or eliminate vertigo symptoms. These include: ? Medicines that control allergies (antihistamines). ? Medicines that control seizures (anticonvulsants). ? Medicines that relieve depression (antidepressants). ? Medicines that relieve anxiety (sedatives).  Head movements to adjust your inner ear back to normal. If your vertigo is caused by an ear problem, your health care provider may recommend certain movements to correct the  problem.  Surgery. This is rare.  Follow these instructions at home: Safety  Move slowly.Avoid sudden body or head movements.  Avoid driving.  Avoid operating heavy machinery.  Avoid doing any tasks that would cause danger to you or  others if you would have a vertigo episode during the task.  If you have trouble walking or keeping your balance, try using a cane for stability. If you feel dizzy or unstable, sit down right away.  Return to your normal activities as told by your health care provider. Ask your health care provider what activities are safe for you. General instructions  Take over-the-counter and prescription medicines only as told by your health care provider.  Avoid certain positions or movements as told by your health care provider.  Drink enough fluid to keep your urine clear or pale yellow.  Keep all follow-up visits as told by your health care provider. This is important. Contact a health care provider if:  Your medicines do not relieve your vertigo or they make it worse.  You have a fever.  Your condition gets worse or you develop new symptoms.  Your family or friends notice any behavioral changes.  Your nausea or vomiting gets worse.  You have numbness or a "pins and needles" sensation in part of your body. Get help right away if:  You have difficulty moving or speaking.  You are always dizzy.  You faint.  You develop severe headaches.  You have weakness in your hands, arms, or legs.  You have changes in your hearing or vision.  You develop a stiff neck.  You develop sensitivity to light. This information is not intended to replace advice given to you by your health care provider. Make sure you discuss any questions you have with your health care provider. Document Released: 10/07/2004 Document Revised: 06/11/2015 Document Reviewed: 04/22/2014 Elsevier Interactive Patient Education  [employer]2018 [employer]

## 2017-02-18 NOTE — Assessment & Plan Note (Signed)
Intermittent for the past several months.  No palpitations since she stopped drinking sweet tea in early January 2019.  Discussed to continue to limit caffeine, work on stress.  Check labs today including TSH, CBC, CMP.  She will update if palpitations return.

## 2017-02-18 NOTE — Progress Notes (Signed)
Subjective:    Patient ID: Melinda Chan, female    DOB: 11-13-1980, 37 y.o.   MRN: [id number]  HPI  Melinda Chan is a 37 year old female who presents today to establish care and discuss the problems mentioned below. Will obtain old records.  1) Anemia: Diagnosed years ago, chronic. She is managed on oral iron for which she takes infrequently, recently restarted this several days ago and takes at bedtime.  She denies recent CBC check.  2) Dizziness: Several weeks ago she was talking with a co-worker and noticed a sudden onset of dizziness for which the room was moving. This lasted one minute then dissipated. She did notice slight dizziness several days later when doing housework, got up from the floor and felt as though the room was spinning. Her last episode was one week ago.   She denies runny, nose, sinus pressure.   3) Palpitations: Intermittent for the past several months that will occur 1-2 times monthly. Most of the time it will occur when resting and will last 2-3 minutes. She denies chest pain, dizziness with her palpitations, near syncope.   Her last episode of palpitations was in early January 2019. She thinks her palpitations came shortly after drinking sweet tea.  She has been under a lot of increased stress with work and family life.  Review of Systems  Constitutional: Negative for fatigue.  HENT: Negative for congestion and rhinorrhea.   Respiratory: Negative for shortness of breath.   Cardiovascular: Positive for palpitations. Negative for chest pain.  Skin: Negative for color change.  Neurological: Positive for dizziness. Negative for weakness.       Past Medical History:  Diagnosis Date  . Anemia   . Seasonal allergies   . SVD (spontaneous vaginal delivery)    x 1     Social History   Socioeconomic History  . Marital status: Married    Spouse name: Not on file  . Number of children: Not on file  . Years of education: Not on file  . Highest education  level: Not on file  Social Needs  . Financial resource strain: Not on file  . Food insecurity - worry: Not on file  . Food insecurity - inability: Not on file  . Transportation needs - medical: Not on file  . Transportation needs - non-medical: Not on file  Occupational History  . Not on file  Tobacco Use  . Smoking status: Never Smoker  . Smokeless tobacco: Never Used  Substance and Sexual Activity  . Alcohol use: No  . Drug use: No  . Sexual activity: Yes    Birth control/protection: None    Comment: 18 ks gestation  Other Topics Concern  . Not on file  Social History Narrative   Married.   3 children.   Works as a Radio producer.    Enjoys listening to music, shopping.     Past Surgical History:  Procedure Laterality Date  . CESAREAN SECTION  2008  . DILATION AND EVACUATION N/A 06/08/2012   Procedure: Ultrasound Guided; DILATATION AND EVACUATION (D&E) 2ND TRIMESTER;  Surgeon: Lenoard Aden, MD;  Location: [facility];  Service: Gynecology;  Laterality: N/A;  45 min.    Family History  Problem Relation Age of Onset  . Hypertension Mother   . Hyperlipidemia Mother   . Heart disease Father   . Arthritis Maternal Grandmother   . Stroke Maternal Grandmother   . Diabetes Maternal Grandfather   . Lung cancer Paternal Grandfather   .  Breast cancer Paternal Grandmother     No Known Allergies  Current Outpatient Medications on File Prior to Visit  Medication Sig Dispense Refill  . ferrous sulfate 325 (65 FE) MG tablet Take 325 mg by mouth daily with breakfast.     No current facility-administered medications on file prior to visit.     BP 116/78   Pulse 75   Temp 98.1 F (36.7 C) (Oral)   Ht 5' 5.5" (1.664 m)   Wt (!) 306 lb (138.8 kg)   LMP 01/31/2017   SpO2 99%   BMI 50.15 kg/m    Objective:   Physical Exam  Constitutional: She is oriented to person, place, and time. She appears well-nourished.  Eyes: EOM are normal.  Neck: Neck supple.    Cardiovascular: Normal rate, regular rhythm and normal heart sounds.  Pulmonary/Chest: Effort normal and breath sounds normal.  Neurological: She is alert and oriented to person, place, and time. No cranial nerve deficit.  Skin: Skin is warm and dry.  Psychiatric: She has a normal mood and affect.          Assessment & Plan:

## 2017-02-18 NOTE — Assessment & Plan Note (Signed)
CBC and iron panel pending. Continue oral iron nightly.

## 2017-02-18 NOTE — Assessment & Plan Note (Signed)
Symptoms of dizziness consistent for vertigo. Discussed precautions and advised to notify if symptoms become persistent.  Neuro exam today unremarkable.  Also check labs including CBC, CMP, TSH.

## 2017-02-20 ENCOUNTER — Other Ambulatory Visit: Payer: Self-pay | Admitting: Primary Care

## 2017-02-20 DIAGNOSIS — D509 Iron deficiency anemia, unspecified: Secondary | ICD-10-CM

## 2017-02-23 ENCOUNTER — Other Ambulatory Visit: Payer: Self-pay | Admitting: Primary Care

## 2017-02-23 DIAGNOSIS — D509 Iron deficiency anemia, unspecified: Secondary | ICD-10-CM

## 2017-03-14 ENCOUNTER — Encounter: Payer: Self-pay | Admitting: Primary Care

## 2017-03-14 ENCOUNTER — Ambulatory Visit (INDEPENDENT_AMBULATORY_CARE_PROVIDER_SITE_OTHER): Payer: BLUE CROSS/BLUE SHIELD | Admitting: Primary Care

## 2017-03-14 ENCOUNTER — Other Ambulatory Visit (HOSPITAL_COMMUNITY)
Admission: RE | Admit: 2017-03-14 | Discharge: 2017-03-14 | Disposition: A | Discharge status: Home / Self Care | Payer: BLUE CROSS/BLUE SHIELD | Source: Ambulatory Visit | Attending: Primary Care | Admitting: Primary Care

## 2017-03-14 VITALS — BP 116/76 | HR 74 | Temp 98.0°F | Ht 65.5 in | Wt 306.5 lb

## 2017-03-14 DIAGNOSIS — Z124 Encounter for screening for malignant neoplasm of cervix: Secondary | ICD-10-CM | POA: Diagnosis not present

## 2017-03-14 DIAGNOSIS — Z0000 Encounter for general adult medical examination without abnormal findings: Secondary | ICD-10-CM | POA: Insufficient documentation

## 2017-03-14 DIAGNOSIS — R42 Dizziness and giddiness: Secondary | ICD-10-CM | POA: Diagnosis not present

## 2017-03-14 DIAGNOSIS — D509 Iron deficiency anemia, unspecified: Secondary | ICD-10-CM | POA: Diagnosis not present

## 2017-03-14 DIAGNOSIS — R002 Palpitations: Secondary | ICD-10-CM

## 2017-03-14 NOTE — Assessment & Plan Note (Signed)
Denies palpitations since initial episode. Compliant to oral iron.

## 2017-03-14 NOTE — Assessment & Plan Note (Signed)
Tdap UTD, declines influenza vaccination. Pap smear due, completed today. Discussed the importance of a healthy diet and regular exercise in order for weight loss, and to reduce the risk of any potential medical problems. Exam unremarkable. Labs pending, she'll return when fasting. Follow up in 1 year.

## 2017-03-14 NOTE — Assessment & Plan Note (Signed)
Denies symptoms since starting oral iron, continue same. Repeat CBC pending.

## 2017-03-14 NOTE — Assessment & Plan Note (Signed)
Discussed to work on diet by increasing vegetables, fruit, whole grains, lean protein. Limit processed food, pasta. Discussed to start exercising.

## 2017-03-14 NOTE — Patient Instructions (Signed)
Increase consumption of vegetables, fruit, whole grains, lean protein. Limit processed food, pasta, fast/restaurant food.  Start exercising. You should be getting 150 minutes of moderate intensity exercise weekly.  We will notify you once we receive your pap results.  Schedule a lab only appointment to return fasting for your labs. No food four hours prior, you may have water and black coffee.   Continue oral iron at the current dose for now, we may change this based off of your lab results.  It was a pleasure to see you today!   Preventive Care 18-39 Years, Female Preventive care refers to lifestyle choices and visits with your health care provider that can promote health and wellness. What does preventive care include?  A yearly physical exam. This is also called an annual well check.  Dental exams once or twice a year.  Routine eye exams. Ask your health care provider how often you should have your eyes checked.  Personal lifestyle choices, including: ? Daily care of your teeth and gums. ? Regular physical activity. ? Eating a healthy diet. ? Avoiding tobacco and drug use. ? Limiting alcohol use. ? Practicing safe sex. ? Taking vitamin and mineral supplements as recommended by your health care provider. What happens during an annual well check? The services and screenings done by your health care provider during your annual well check will depend on your age, overall health, lifestyle risk factors, and family history of disease. Counseling Your health care provider may ask you questions about your:  Alcohol use.  Tobacco use.  Drug use.  Emotional well-being.  Home and relationship well-being.  Sexual activity.  Eating habits.  Work and work Statistician.  Method of birth control.  Menstrual cycle.  Pregnancy history.  Screening You may have the following tests or measurements:  Height, weight, and BMI.  Diabetes screening. This is done by checking  your blood sugar (glucose) after you have not eaten for a while (fasting).  Blood pressure.  Lipid and cholesterol levels. These may be checked every 5 years starting at age 67.  Skin check.  Hepatitis C blood test.  Hepatitis B blood test.  Sexually transmitted disease (STD) testing.  BRCA-related cancer screening. This may be done if you have a family history of breast, ovarian, tubal, or peritoneal cancers.  Pelvic exam and Pap test. This may be done every 3 years starting at age 21. Starting at age 38, this may be done every 5 years if you have a Pap test in combination with an HPV test.  Discuss your test results, treatment options, and if necessary, the need for more tests with your health care provider. Vaccines Your health care provider may recommend certain vaccines, such as:  Influenza vaccine. This is recommended every year.  Tetanus, diphtheria, and acellular pertussis (Tdap, Td) vaccine. You may need a Td booster every 10 years.  Varicella vaccine. You may need this if you have not been vaccinated.  HPV vaccine. If you are 25 or younger, you may need three doses over 6 months.  Measles, mumps, and rubella (MMR) vaccine. You may need at least one dose of MMR. You may also need a second dose.  Pneumococcal 13-valent conjugate (PCV13) vaccine. You may need this if you have certain conditions and were not previously vaccinated.  Pneumococcal polysaccharide (PPSV23) vaccine. You may need one or two doses if you smoke cigarettes or if you have certain conditions.  Meningococcal vaccine. One dose is recommended if you are age 12-21 years  and a Market researcher living in a residence hall, or if you have one of several medical conditions. You may also need additional booster doses.  Hepatitis A vaccine. You may need this if you have certain conditions or if you travel or work in places where you may be exposed to hepatitis A.  Hepatitis B vaccine. You may need  this if you have certain conditions or if you travel or work in places where you may be exposed to hepatitis B.  Haemophilus influenzae type b (Hib) vaccine. You may need this if you have certain risk factors.  Talk to your health care provider about which screenings and vaccines you need and how often you need them. This information is not intended to replace advice given to you by your health care provider. Make sure you discuss any questions you have with your health care provider. Document Released: 02/23/2001 Document Revised: 09/17/2015 Document Reviewed: 10/29/2014 Elsevier Interactive Patient Education  [employer].

## 2017-03-14 NOTE — Assessment & Plan Note (Signed)
Compliant to oral iron 28 mg, repeat CBC pending.

## 2017-03-14 NOTE — Progress Notes (Signed)
Subjective:    Patient ID: Melinda Chan, female    DOB: 1980/09/25, 37 y.o.   MRN: [id number]  HPI  Melinda Chan is a 37 year old female who presents today for complete physical.  Immunizations: -Tetanus: Completed in 2016 -Influenza: Did not complete this season, declines   Diet: She endorses a fair diet Breakfast: Crackers, fruit Lunch: [place] on Friday, salad, chicken, pasta Dinner: Pasta, chicken, vegetables, salad Snacks: Fruit, nuts, cheese it's, goldfish Desserts: 3-4 times weekly  Beverages: Water, coffee, occasional soda, seltzer water  Exercise: She does not currently exercise Eye exam: Completed one year ago Dental exam: Completed in 2018 Pap Smear: Completed in 2016   Review of Systems  Constitutional: Negative for unexpected weight change.  HENT: Negative for rhinorrhea.   Respiratory: Negative for cough and shortness of breath.   Cardiovascular: Negative for chest pain and palpitations.  Gastrointestinal: Negative for constipation and diarrhea.  Genitourinary: Negative for difficulty urinating and menstrual problem.  Musculoskeletal: Negative for arthralgias and myalgias.  Skin: Negative for rash.  Allergic/Immunologic: Negative for environmental allergies.  Neurological: Negative for dizziness, numbness and headaches.  Psychiatric/Behavioral: The patient is not nervous/anxious.        Past Medical History:  Diagnosis Date  . Anemia   . Seasonal allergies   . SVD (spontaneous vaginal delivery)    x 1     Social History   Socioeconomic History  . Marital status: Married    Spouse name: Not on file  . Number of children: Not on file  . Years of education: Not on file  . Highest education level: Not on file  Social Needs  . Financial resource strain: Not on file  . Food insecurity - worry: Not on file  . Food insecurity - inability: Not on file  . Transportation needs - medical: Not on file  . Transportation needs - non-medical: Not on  file  Occupational History  . Not on file  Tobacco Use  . Smoking status: Never Smoker  . Smokeless tobacco: Never Used  Substance and Sexual Activity  . Alcohol use: No  . Drug use: No  . Sexual activity: Yes    Birth control/protection: None    Comment: 18 ks gestation  Other Topics Concern  . Not on file  Social History Narrative   Married.   3 children.   Works as a Radio producer.    Enjoys listening to music, shopping.     Past Surgical History:  Procedure Laterality Date  . CESAREAN SECTION  2008  . DILATION AND EVACUATION N/A 06/08/2012   Procedure: Ultrasound Guided; DILATATION AND EVACUATION (D&E) 2ND TRIMESTER;  Surgeon: Lenoard Aden, MD;  Location: [facility];  Service: Gynecology;  Laterality: N/A;  45 min.    Family History  Problem Relation Age of Onset  . Hypertension Mother   . Hyperlipidemia Mother   . Heart disease Father   . Arthritis Maternal Grandmother   . Stroke Maternal Grandmother   . Diabetes Maternal Grandfather   . Lung cancer Paternal Grandfather   . Breast cancer Paternal Grandmother     No Known Allergies  Current Outpatient Medications on File Prior to Visit  Medication Sig Dispense Refill  . FERROUS SULFATE PO Take 28 mg by mouth at bedtime.     No current facility-administered medications on file prior to visit.     BP 116/76   Pulse 74   Temp 98 F (36.7 C) (Oral)   Ht 5' 5.5" (  1.664 m)   Wt (!) 306 lb 8 oz (139 kg)   LMP 02/24/2017   SpO2 99%   BMI 50.23 kg/m    Objective:   Physical Exam  Constitutional: She is oriented to person, place, and time. She appears well-nourished.  HENT:  Right Ear: Tympanic membrane and ear canal normal.  Left Ear: Tympanic membrane and ear canal normal.  Nose: Nose normal.  Mouth/Throat: Oropharynx is clear and moist.  Eyes: Conjunctivae and EOM are normal. Pupils are equal, round, and reactive to light.  Neck: Neck supple. No thyromegaly present.  Cardiovascular: Normal rate  and regular rhythm.  No murmur heard. Pulmonary/Chest: Effort normal and breath sounds normal. She has no rales.  Abdominal: Soft. Bowel sounds are normal. There is no tenderness.  Musculoskeletal: Normal range of motion.  Lymphadenopathy:    She has no cervical adenopathy.  Neurological: She is alert and oriented to person, place, and time. She has normal reflexes. No cranial nerve deficit.  Skin: Skin is warm and dry. No rash noted.  Psychiatric: She has a normal mood and affect.          Assessment & Plan:

## 2017-03-17 LAB — CYTOLOGY - PAP
Diagnosis: NEGATIVE
HPV: NOT DETECTED

## 2017-03-21 ENCOUNTER — Other Ambulatory Visit (INDEPENDENT_AMBULATORY_CARE_PROVIDER_SITE_OTHER): Payer: BLUE CROSS/BLUE SHIELD

## 2017-03-21 DIAGNOSIS — Z0000 Encounter for general adult medical examination without abnormal findings: Secondary | ICD-10-CM | POA: Diagnosis not present

## 2017-03-21 DIAGNOSIS — D509 Iron deficiency anemia, unspecified: Secondary | ICD-10-CM | POA: Diagnosis not present

## 2017-03-21 LAB — IBC PANEL
IRON: 36 ug/dL — AB (ref 42–145)
Saturation Ratios: 10.7 % — ABNORMAL LOW (ref 20.0–50.0)
Transferrin: 241 mg/dL (ref 212.0–360.0)

## 2017-03-21 LAB — CBC
HCT: 32.5 % — ABNORMAL LOW (ref 36.0–46.0)
Hemoglobin: 10.5 g/dL — ABNORMAL LOW (ref 12.0–15.0)
MCHC: 32.2 g/dL (ref 30.0–36.0)
MCV: 84.5 fl (ref 78.0–100.0)
Platelets: 291 10*3/uL (ref 150.0–400.0)
RBC: 3.85 Mil/uL — ABNORMAL LOW (ref 3.87–5.11)
RDW: 15.7 % — ABNORMAL HIGH (ref 11.5–15.5)
WBC: 7 10*3/uL (ref 4.0–10.5)

## 2017-03-21 LAB — LIPID PANEL
CHOL/HDL RATIO: 3
Cholesterol: 149 mg/dL (ref 0–200)
HDL: 56.3 mg/dL (ref >39.00)
LDL CALC: 81 mg/dL (ref 0–99)
NonHDL: 92.98
TRIGLYCERIDES: 58 mg/dL (ref 0.0–149.0)
VLDL: 11.6 mg/dL (ref 0.0–40.0)

## 2021-09-23 ENCOUNTER — Ambulatory Visit (INDEPENDENT_AMBULATORY_CARE_PROVIDER_SITE_OTHER): Payer: Managed Care, Other (non HMO) | Admitting: Family

## 2021-09-23 ENCOUNTER — Encounter: Payer: Self-pay | Admitting: Family

## 2021-09-23 ENCOUNTER — Encounter: Payer: Self-pay | Admitting: Primary Care

## 2021-09-23 VITALS — BP 116/80 | HR 86 | Temp 98.2°F | Resp 16 | Ht 65.75 in | Wt 321.0 lb

## 2021-09-23 DIAGNOSIS — Z6841 Body Mass Index (BMI) 40.0 and over, adult: Secondary | ICD-10-CM

## 2021-09-23 DIAGNOSIS — Z0001 Encounter for general adult medical examination with abnormal findings: Secondary | ICD-10-CM | POA: Diagnosis not present

## 2021-09-23 DIAGNOSIS — Z1231 Encounter for screening mammogram for malignant neoplasm of breast: Secondary | ICD-10-CM | POA: Insufficient documentation

## 2021-09-23 DIAGNOSIS — Z13220 Encounter for screening for lipoid disorders: Secondary | ICD-10-CM | POA: Diagnosis not present

## 2021-09-23 DIAGNOSIS — D509 Iron deficiency anemia, unspecified: Secondary | ICD-10-CM | POA: Insufficient documentation

## 2021-09-23 LAB — CBC WITH DIFFERENTIAL/PLATELET
Basophils Absolute: 0 10*3/uL (ref 0.0–0.1)
Basophils Relative: 0.7 % (ref 0.0–3.0)
Eosinophils Absolute: 0.1 10*3/uL (ref 0.0–0.7)
Eosinophils Relative: 1.9 % (ref 0.0–5.0)
HCT: 32 % — ABNORMAL LOW (ref 36.0–46.0)
Hemoglobin: 10.5 g/dL — ABNORMAL LOW (ref 12.0–15.0)
Lymphocytes Relative: 35.4 % (ref 12.0–46.0)
Lymphs Abs: 2.4 10*3/uL (ref 0.7–4.0)
MCHC: 32.8 g/dL (ref 30.0–36.0)
MCV: 87.3 fl (ref 78.0–100.0)
Monocytes Absolute: 0.5 10*3/uL (ref 0.1–1.0)
Monocytes Relative: 7.9 % (ref 3.0–12.0)
Neutro Abs: 3.7 10*3/uL (ref 1.4–7.7)
Neutrophils Relative %: 54.1 % (ref 43.0–77.0)
Platelets: 264 10*3/uL (ref 150.0–400.0)
RBC: 3.66 Mil/uL — ABNORMAL LOW (ref 3.87–5.11)
RDW: 14.2 % (ref 11.5–15.5)
WBC: 6.9 10*3/uL (ref 4.0–10.5)

## 2021-09-23 LAB — HEMOGLOBIN A1C: Hgb A1c MFr Bld: 4.9 % (ref 4.6–6.5)

## 2021-09-23 LAB — LIPID PANEL
Cholesterol: 195 mg/dL (ref 0–200)
HDL: 64 mg/dL (ref >39.00)
LDL Cholesterol: 116 mg/dL — ABNORMAL HIGH (ref 0–99)
NonHDL: 130.88
Total CHOL/HDL Ratio: 3
Triglycerides: 73 mg/dL (ref 0.0–149.0)
VLDL: 14.6 mg/dL (ref 0.0–40.0)

## 2021-09-23 LAB — COMPREHENSIVE METABOLIC PANEL
ALT: 11 U/L (ref 0–35)
AST: 17 U/L (ref 0–37)
Albumin: 3.6 g/dL (ref 3.5–5.2)
Alkaline Phosphatase: 63 U/L (ref 39–117)
BUN: 11 mg/dL (ref 6–23)
CO2: 27 mEq/L (ref 19–32)
Calcium: 8.7 mg/dL (ref 8.4–10.5)
Chloride: 102 mEq/L (ref 96–112)
Creatinine, Ser: 0.78 mg/dL (ref 0.40–1.20)
GFR: 92.89 mL/min (ref >60.00)
Glucose, Bld: 77 mg/dL (ref 70–99)
Potassium: 3.5 mEq/L (ref 3.5–5.1)
Sodium: 136 mEq/L (ref 135–145)
Total Bilirubin: 0.7 mg/dL (ref 0.2–1.2)
Total Protein: 7.1 g/dL (ref 6.0–8.3)

## 2021-09-23 NOTE — Patient Instructions (Addendum)
Welcome to our clinic, I am happy to have you as my new patient. I am excited to continue on this healthcare journey with you.  Stop by the lab prior to leaving today. I will notify you of your results once received.   ------------------------------------ I have sent an electronic order over to your preferred location for the following:   []  2D Mammogram  [x]  3D Mammogram  []  Bone Density   Please give this center a call to get scheduled at your convenience.  [x]  [facility]  [address]   [place] [address]  [phone]  Make sure to wear two piece  clothing  No lotions powders or deodorants the day of the appointment Make sure to bring picture ID and insurance card.  Bring list of medications you are currently taking including any supplements.   ------------------------------------   Please keep in mind Any my chart messages you send have up to a three business day turnaround for a response.  Phone calls may take up to a one full business day turnaround for a  response.   If you need a medication refill I recommend you request it through the pharmacy as this is easiest for us rather than sending a message and or phone call.   Due to recent changes in healthcare laws, you may see results of your imaging and/or laboratory studies on MyChart before I have had a chance to review them.  I understand that in some cases there may be results that are confusing or concerning to you. Please understand that not all results are received at the same time and often I may need to interpret multiple results in order to provide you with the best plan of care or course of treatment. Therefore, I ask that you please give me 2 business days to thoroughly review all your results before contacting my office for clarification. Should we see a critical lab result, you will be contacted sooner.   It was a pleasure seeing you today! Please  do not hesitate to reach out with any questions and or concerns.  Regards,   [employer] FNP-C  

## 2021-09-23 NOTE — Assessment & Plan Note (Signed)
Lipid panel ordered pending results.   

## 2021-09-23 NOTE — Assessment & Plan Note (Signed)
Advised patient to work on diet and exercise as tolerated.  Printout given for diet and exercise as well as a diet as per patient.

## 2021-09-23 NOTE — Assessment & Plan Note (Signed)
Patient Counseling(The following topics were reviewed): ? Preventative care handout given to pt  ?Health maintenance and immunizations reviewed. Please refer to Health maintenance section. ?Pt advised on safe sex, wearing seatbelts in car, and proper nutrition ?labwork ordered today for annual ?Dental health: Discussed importance of regular tooth brushing, flossing, and dental visits. ? ? ?

## 2021-09-23 NOTE — Assessment & Plan Note (Signed)
Mammogram ordered. Pending results. 

## 2021-09-23 NOTE — Progress Notes (Signed)
New Patient Office Visit  Subjective:  Patient ID: Melinda Chan, female    DOB: [birth year]  Age: 41 y.o. MRN: [id number]  CC:  Chief Complaint  Patient presents with   Establish Care    HPI Melinda Chan is here to establish care as a new patient.  Prior provider was: Allie Bossier, FNP back in [id number].  Pt is without acute concerns.   LMP: slightly irregular as of late. Length has decreased to 2-3 days. Pt states no change of pregnancy. Typically monthly. Expecting in the next one week. Not overly heavy.   Pap: overdue.  TD: thinks has had in the last ten years.   Eye exam, yearly up to date.  Dental exam overdue.   chronic concerns:  IDA: in the past has not been retested anytime recently. Denies fatigue.   Obesity: concerned about her weight. Walking throughout the week, about two days at a time five minutes at a time. Diet ebbs and flows, struggles with consistency. Sometimes will skip meals and or will eat more breads and sweets at a time. Trying to have more veggies and less breads and sweets but struggles here.    History reviewed. No pertinent past medical history.  Past Surgical History:  Procedure Laterality Date   CESAREAN SECTION     2008   DILATION AND CURETTAGE OF UTERUS     2014   DILATION AND EVACUATION     2014, five months pregnancy baby passed away.    Family History  Problem Relation Age of Onset   Hypertension Mother    Asthma Mother    Heart disease Father    Diabetes Maternal Grandmother    Breast cancer Paternal Grandmother        in her 41's    Social History   Socioeconomic History   Marital status: Married    Spouse name: Not on file   Number of children: 3   Years of education: Not on file   Highest education level: Not on file  Occupational History   Occupation: Chartered certified accountant  Tobacco Use   Smoking status: Never   Smokeless tobacco: Never  Vaping Use   Vaping Use: Never used  Substance and Sexual Activity    Alcohol use: Yes    Comment: On birthday once a year   Drug use: Never   Sexual activity: Yes    Partners: Male    Birth control/protection: Coitus interruptus  Other Topics Concern   Not on file  Social History Narrative   Three boys   Social Determinants of Health   Financial Resource Strain: Not on file  Food Insecurity: Not on file  Transportation Needs: Not on file  Physical Activity: Not on file  Stress: Not on file  Social Connections: Not on file  Intimate Partner Violence: Not on file    No outpatient medications prior to visit.   No facility-administered medications prior to visit.    No Known Allergies  ROS Review of Systems  Review of Systems  Respiratory:  Negative for shortness of breath.   Cardiovascular:  Negative for chest pain and palpitations.  Gastrointestinal:  Negative for constipation and diarrhea.  Genitourinary:  Negative for dysuria, frequency and urgency.  Musculoskeletal:  Negative for myalgias.  Psychiatric/Behavioral:  Negative for depression and suicidal ideas.   All other systems reviewed and are negative.    Objective:    Physical Exam Vitals reviewed.  Constitutional:      General: She is not in  acute distress.    Appearance: Normal appearance. She is obese. She is not ill-appearing, toxic-appearing or diaphoretic.  HENT:     Right Ear: Tympanic membrane normal.     Left Ear: Tympanic membrane normal.     Mouth/Throat:     Mouth: Mucous membranes are moist.     Pharynx: No pharyngeal swelling.     Tonsils: No tonsillar exudate.  Eyes:     Extraocular Movements: Extraocular movements intact.     Conjunctiva/sclera: Conjunctivae normal.     Pupils: Pupils are equal, round, and reactive to light.  Neck:     Thyroid: No thyroid mass.  Cardiovascular:     Rate and Rhythm: Normal rate and regular rhythm.  Pulmonary:     Effort: Pulmonary effort is normal.     Breath sounds: Normal breath sounds.  Abdominal:     General:  Abdomen is flat. Bowel sounds are normal.     Palpations: Abdomen is soft.  Musculoskeletal:        General: Normal range of motion.  Lymphadenopathy:     Cervical:     Right cervical: No superficial cervical adenopathy.    Left cervical: No superficial cervical adenopathy.  Skin:    General: Skin is warm.     Capillary Refill: Capillary refill takes less than 2 seconds.  Neurological:     General: No focal deficit present.     Mental Status: She is alert and oriented to person, place, and time.  Psychiatric:        Mood and Affect: Mood normal.        Behavior: Behavior normal.        Thought Content: Thought content normal.        Judgment: Judgment normal.       BP 116/80   Pulse 86   Temp 98.2 F (36.8 C)   Resp 16   Ht 5' 5.75" (1.67 m)   Wt (!) 321 lb (145.6 kg)   SpO2 99%   BMI 52.21 kg/m  Wt Readings from Last 3 Encounters:  09/23/21 (!) 321 lb (145.6 kg)     Health Maintenance Due  Topic Date Due   COVID-19 Vaccine (1) Never done   TETANUS/TDAP  Never done   PAP SMEAR-Modifier  Never done    There are no preventive care reminders to display for this patient.  No results found for: "TSH" No results found for: "WBC", "HGB", "HCT", "MCV", "PLT" No results found for: "NA", "K", "CHLORIDE", "CO2", "GLUCOSE", "BUN", "CREATININE", "BILITOT", "ALKPHOS", "AST", "ALT", "PROT", "ALBUMIN", "CALCIUM", "ANIONGAP", "EGFR", "GFR" No results found for: "CHOL" No results found for: "HDL" No results found for: "LDLCALC" No results found for: "TRIG" No results found for: "CHOLHDL" No results found for: "HGBA1C"    Assessment & Plan:   Problem List Items Addressed This Visit       Other   Class 3 severe obesity due to excess calories without serious comorbidity with body mass index (BMI) of 50.0 to 59.9 in adult [facility])    Advised patient to work on diet and exercise as tolerated.  Printout given for diet and exercise as well as a diet as per patient.       Relevant Orders   Hemoglobin A1c   Encounter for general adult medical examination with abnormal findings - Primary    Patient Counseling(The following topics were reviewed):  Preventative care handout given to pt  Health maintenance and immunizations reviewed. Please refer to Health maintenance section.  Pt advised on safe sex, wearing seatbelts in car, and proper nutrition labwork ordered today for annual Dental health: Discussed importance of regular tooth brushing, flossing, and dental visits.        Relevant Orders   Comprehensive metabolic panel   Lipid panel   CBC with Differential/Platelet   Hemoglobin A1c   Screening for lipoid disorders    Lipid panel ordered pending results.        Relevant Orders   Lipid panel   Iron deficiency anemia    Cbc ordered pending results      Encounter for screening mammogram for malignant neoplasm of breast    Mammogram ordered. Pending results.       Relevant Orders   MM 3D SCREEN BREAST BILATERAL    No orders of the defined types were placed in this encounter.   Follow-up: Return for schedule pap smear with next follow up.    Eugenia Pancoast, FNP

## 2021-09-23 NOTE — Assessment & Plan Note (Signed)
Cbc ordered pending results. 

## 2021-09-24 ENCOUNTER — Other Ambulatory Visit (INDEPENDENT_AMBULATORY_CARE_PROVIDER_SITE_OTHER): Payer: Managed Care, Other (non HMO)

## 2021-09-24 ENCOUNTER — Other Ambulatory Visit: Payer: Self-pay | Admitting: Family

## 2021-09-24 DIAGNOSIS — D509 Iron deficiency anemia, unspecified: Secondary | ICD-10-CM | POA: Diagnosis not present

## 2021-09-24 LAB — IBC + FERRITIN
Ferritin: 22.2 ng/mL (ref 10.0–291.0)
Iron: 51 ug/dL (ref 42–145)
Saturation Ratios: 17.8 % — ABNORMAL LOW (ref 20.0–50.0)
TIBC: 287 ug/dL (ref 250.0–450.0)
Transferrin: 205 mg/dL — ABNORMAL LOW (ref 212.0–360.0)

## 2021-09-24 NOTE — Progress Notes (Signed)
Can we add ibc ferritin? Order placed

## 2023-10-18 ENCOUNTER — Encounter: Payer: Self-pay | Admitting: Family Medicine

## 2023-10-18 ENCOUNTER — Ambulatory Visit (INDEPENDENT_AMBULATORY_CARE_PROVIDER_SITE_OTHER): Admitting: Family Medicine

## 2023-10-18 VITALS — BP 122/77 | HR 63 | Temp 98.6°F | Ht 65.0 in | Wt 324.3 lb

## 2023-10-18 DIAGNOSIS — N926 Irregular menstruation, unspecified: Secondary | ICD-10-CM | POA: Insufficient documentation

## 2023-10-18 DIAGNOSIS — E66813 Obesity, class 3: Secondary | ICD-10-CM | POA: Diagnosis not present

## 2023-10-18 DIAGNOSIS — R42 Dizziness and giddiness: Secondary | ICD-10-CM

## 2023-10-18 DIAGNOSIS — D508 Other iron deficiency anemias: Secondary | ICD-10-CM | POA: Diagnosis not present

## 2023-10-18 DIAGNOSIS — Z1211 Encounter for screening for malignant neoplasm of colon: Secondary | ICD-10-CM

## 2023-10-18 DIAGNOSIS — N951 Menopausal and female climacteric states: Secondary | ICD-10-CM | POA: Diagnosis not present

## 2023-10-18 DIAGNOSIS — Z0000 Encounter for general adult medical examination without abnormal findings: Secondary | ICD-10-CM | POA: Insufficient documentation

## 2023-10-18 DIAGNOSIS — Z1231 Encounter for screening mammogram for malignant neoplasm of breast: Secondary | ICD-10-CM

## 2023-10-18 DIAGNOSIS — Z6841 Body Mass Index (BMI) 40.0 and over, adult: Secondary | ICD-10-CM

## 2023-10-18 NOTE — Patient Instructions (Signed)
 [facility] [address] [place],  [place]  [address] Main: [phone]    To keep you healthy, please keep in mind the following health maintenance items that you are due for:   Health Maintenance Due  Topic Date Due   Mammogram  Never done   Cervical Cancer Screening (HPV/Pap Cotest)  03/15/2022   Colonoscopy  Never done     Best Wishes,   Dr. Lang

## 2023-10-18 NOTE — Assessment & Plan Note (Signed)
  Last Pap smear in 2019 was negative. Due for colon cancer screening and mammogram. - Refer to gastroenterology for colonoscopy. - Order mammogram for breast cancer screening. - Schedule Pap smear in approximately, before end of 2025 - Plan for complete physical exam in the spring.

## 2023-10-18 NOTE — Progress Notes (Signed)
 New patient visit   Patient: Melinda Chan   DOB: December 22, 1980   43 y.o. Female  MRN: [id number] Visit Date: 10/18/2023  Today's healthcare provider: Rockie Agent, MD   Chief Complaint  Patient presents with   Establish Care    Patient is present to establish care with new pcp.  Patient is eating a normal diet, trying to incorporate veggies and better foods. Patient is doing walking as exercise throughout the week  Vaccines: declines today Screenings: Agrees to mammo, colonoscopy and pap at a later date    Subjective    [name] is a 43 y.o. female who presents today as a new patient to establish care.   HPI     Establish Care    Additional comments: Patient is present to establish care with new pcp.  Patient is eating a normal diet, trying to incorporate veggies and better foods. Patient is doing walking as exercise throughout the week  Vaccines: declines today Screenings: Agrees to mammo, colonoscopy and pap at a later date       Last edited by Cherry Chiquita HERO, CMA on 10/18/2023  9:16 AM.       Discussed the use of AI scribe software for clinical note transcription with the patient, who gave verbal consent to proceed.  History of Present Illness Melinda Chan is a 43 year old female who presents to establish care.  She has a history of iron deficiency anemia, previously requiring a blood transfusion following complications from a D&E and D&C procedure in 2014. She has not taken her iron supplements, ferrous sulfate , for about a year and a half as she hasn't felt the need. Her last Pap smear in 2019 was negative for intraepithelial lesions or malignancy and high-risk HPV.  She has class III obesity with a BMI of 53 and wants to lose at least 120 pounds naturally, without medication or surgery. She previously lost 15-20 pounds through intermittent fasting and increased water intake. She aims to increase her physical activity, currently walking  10-20 minutes during breaks, and plans to resume a routine that includes more consistent exercise.  She experiences symptoms suggestive of perimenopause, including hot flashes and irregular menstrual cycles over the past year. Her cycles are inconsistent, with some months without a cycle and others with partial cycles. Her mother and grandmother had early menopause due to hysterectomies, but she has not experienced fibroid issues.  She has a history of vertigo, which she experienced last year but has not had issues with since improving her diet and hydration. She attributes the improvement to increased water intake and healthier eating habits.  She has seasonal allergies and a history of arthritis.     Past Medical History:  Diagnosis Date   Anemia    Arthritis 12/2020   Blood transfusion without reported diagnosis 06/2012   [place]   Seasonal allergies    SVD (spontaneous vaginal delivery)    x 1    Outpatient Medications Prior to Visit  Medication Sig   FERROUS SULFATE  PO Take 28 mg by mouth at bedtime. (Patient not taking: Reported on 10/18/2023)   No facility-administered medications prior to visit.    Past Surgical History:  Procedure Laterality Date   CESAREAN SECTION  2008   CESAREAN SECTION     2008   DILATION AND CURETTAGE OF UTERUS     2014   DILATION AND EVACUATION N/A 06/08/2012   Procedure: Ultrasound Guided; DILATATION AND EVACUATION (D&E) 2ND TRIMESTER;  Surgeon: Charlie  JINNY Flowers, MD;  Location: [facility];  Service: Gynecology;  Laterality: N/A;  45 min.   DILATION AND EVACUATION     2014, five months pregnancy baby passed away.   Family Status  Relation Name Status   Mother Ethel Alive   Father Georgina Alive   MGM Rowe Deceased   MGF Georgina Deceased   PGM Moore Deceased   PGF  Deceased  No partnership data on file   Family History  Problem Relation Age of Onset   Hypertension Mother    Asthma Mother    Hyperlipidemia Mother    Heart disease Father     Diabetes Maternal Grandmother    Arthritis Maternal Grandmother    Stroke Maternal Grandmother    Diabetes Maternal Grandfather    Breast cancer Paternal Grandmother        in her 88's   Cancer Paternal Grandmother    Lung cancer Paternal Grandfather    Social History   Socioeconomic History   Marital status: Married    Spouse name: Not on file   Number of children: 3   Years of education: Not on file   Highest education level: Bachelor's degree (e.g., BA, AB, BS)  Occupational History   Occupation: Acupuncturist  Tobacco Use   Smoking status: Never   Smokeless tobacco: Never  Vaping Use   Vaping status: Never Used  Substance and Sexual Activity   Alcohol use: Yes    Comment: On birthday once a year   Drug use: Never   Sexual activity: Yes    Partners: Male    Birth control/protection: Coitus interruptus, None    Comment: 18 ks gestation  Other Topics Concern   Not on file  Social History Narrative   ** Merged History Encounter **       ** Data from: 03/14/17 Enc Dept: [facility]   Married. 3 children. Works as a Radio producer.  Enjoys listening to music, shopping.        ** Data from: 09/23/21 Enc Dept: [facility]   Three boys   Social Drivers of Corporate investment banker Strain: Low Risk  (10/17/2023)   Overall Financial Resource Strain (CARDIA)    Difficulty of Paying Living Expenses: Not hard at all  Food Insecurity: No Food Insecurity (10/17/2023)   Hunger Vital Sign    Worried About Running Out of Food in the Last Year: Never true    Ran Out of Food in the Last Year: Never true  Transportation Needs: No Transportation Needs (10/17/2023)   PRAPARE - Administrator, Civil Service (Medical): No    Lack of Transportation (Non-Medical): No  Physical Activity: Insufficiently Active (10/17/2023)   Exercise Vital Sign    Days of Exercise per Week: 3 days    Minutes of Exercise per Session: 10 min  Stress: No Stress Concern Present  (10/17/2023)   [employer] of Occupational Health - Occupational Stress Questionnaire    Feeling of Stress: Not at all  Social Connections: Socially Integrated (10/17/2023)   Social Connection and Isolation Panel    Frequency of Communication with Friends and Family: More than three times a week    Frequency of Social Gatherings with Friends and Family: More than three times a week    Attends Religious Services: More than 4 times per year    Active Member of [employer] or Organizations: Yes    Attends Banker Meetings: 1 to 4 times per year  Marital Status: Married     No Known Allergies  Immunization History  Administered Date(s) Administered   PFIZER Comirnaty(Gray Top)Covid-19 Tri-Sucrose Vaccine 03/01/2020   PFIZER(Purple Top)SARS-COV-2 Vaccination 04/04/2019, 05/02/2019   Td 07/03/1996   Tdap 03/16/2014    Health Maintenance  Topic Date Due   Mammogram  Never done   Cervical Cancer Screening (HPV/Pap Cotest)  03/15/2022   Colonoscopy  Never done   COVID-19 Vaccine (4 - 2025-26 season) 11/03/2023 (Originally 09/12/2023)   Influenza Vaccine  04/10/2024 (Originally 08/12/2023)   Hepatitis B Vaccines 19-59 Average Risk (1 of 3 - 19+ 3-dose series) 10/17/2024 (Originally 02/04/1997)   HPV VACCINES (1 - 3-dose SCDM series) 10/17/2024 (Originally 02/04/2005)   DTaP/Tdap/Td (3 - Td or Tdap) 03/15/2024   Hepatitis C Screening  Completed   HIV Screening  Completed   Pneumococcal Vaccine  Aged Out   Meningococcal B Vaccine  Aged Out    Patient Care Team: Sharma Coyer, MD as PCP - General (Family Medicine) Joshua Lin, PA-C as Physician Assistant (Orthopedic Surgery) Gretta Comer POUR, NP (Internal Medicine)  Review of Systems  Last CBC Lab Results  Component Value Date   WBC 6.9 09/23/2021   HGB 10.5 (L) 09/23/2021   HCT 32.0 (L) 09/23/2021   MCV 87.3 09/23/2021   MCH 26.8 (L) 02/18/2017   RDW 14.2 09/23/2021   PLT 264.0 09/23/2021   Last  metabolic panel Lab Results  Component Value Date   GLUCOSE 77 09/23/2021   NA 136 09/23/2021   K 3.5 09/23/2021   CL 102 09/23/2021   CO2 27 09/23/2021   BUN 11 09/23/2021   CREATININE 0.78 09/23/2021   GFR 92.89 09/23/2021   CALCIUM 8.7 09/23/2021   PROT 7.1 09/23/2021   ALBUMIN 3.6 09/23/2021   BILITOT 0.7 09/23/2021   ALKPHOS 63 09/23/2021   AST 17 09/23/2021   ALT 11 09/23/2021   ANIONGAP 5 (L) 06/29/2012   Last lipids Lab Results  Component Value Date   CHOL 195 09/23/2021   HDL 64.00 09/23/2021   LDLCALC 116 (H) 09/23/2021   LDLDIRECT 87.6 07/19/2013   TRIG 73.0 09/23/2021   CHOLHDL 3 09/23/2021   Last hemoglobin A1c Lab Results  Component Value Date   HGBA1C 4.9 09/23/2021   Last thyroid  functions Lab Results  Component Value Date   TSH 1.32 02/18/2017   Last vitamin D No results found for: 25OHVITD2, 25OHVITD3, VD25OH Last vitamin B12 and Folate No results found for: VITAMINB12, FOLATE      Objective    BP 122/77 (BP Location: Right Arm, Patient Position: Sitting, Cuff Size: Normal)   Pulse 63   Temp 98.6 F (37 C) (Oral)   Ht 5' 5 (1.651 m)   Wt (!) 324 lb 4.8 oz (147.1 kg)   LMP  (LMP Unknown)   SpO2 100%   Breastfeeding No   BMI 53.97 kg/m  BP Readings from Last 3 Encounters:  10/18/23 122/77  09/23/21 116/80  03/14/17 116/76   Wt Readings from Last 3 Encounters:  10/18/23 (!) 324 lb 4.8 oz (147.1 kg)  09/23/21 (!) 321 lb (145.6 kg)  03/14/17 (!) 306 lb 8 oz (139 kg)        Depression Screen    10/18/2023    9:12 AM 12/26/2013    3:27 PM  PHQ 2/9 Scores  PHQ - 2 Score 0 0  PHQ- 9 Score 1    No results found for any visits on 10/18/23.   Physical Exam Vitals reviewed.  Constitutional:      General: She is not in acute distress.    Appearance: Normal appearance. She is not ill-appearing.  Neck:     Thyroid : No thyroid  mass, thyromegaly or thyroid  tenderness.  Cardiovascular:     Rate and Rhythm: Normal  rate and regular rhythm.  Pulmonary:     Effort: Pulmonary effort is normal. No respiratory distress.     Breath sounds: No wheezing, rhonchi or rales.  Neurological:     Mental Status: She is alert and oriented to person, place, and time.  Psychiatric:        Mood and Affect: Mood normal.        Behavior: Behavior normal.        Assessment & Plan      Problem List Items Addressed This Visit     Class 3 severe obesity due to excess calories without serious comorbidity with body mass index (BMI) of 50.0 to 59.9 in adult [facility])   Class 3 Obesity, Chronic condition  Class 3 obesity with a BMI of 53. Motivated to lose weight naturally without medications or surgery. Previously lost 20 pounds through intermittent fasting and dietary changes. Current weight is 324 pounds. Aims to lose 120 pounds. Engages in walking and is mindful of diet, but desires to increase physical activity. - Encourage continuation of intermittent fasting and low-carb diet. - Advise increasing physical activity to 200-240 minutes per week of moderate intensity exercise. - Recommend aiming for 150 grams of protein intake daily. - Schedule follow-up in 6 months for weight management and lifestyle changes.      Relevant Orders   Comprehensive Metabolic Panel (CMET)   Hemoglobin A1c   Lipid panel   TSH + free T4   Healthcare maintenance    Last Pap smear in 2019 was negative. Due for colon cancer screening and mammogram. - Refer to gastroenterology for colonoscopy. - Order mammogram for breast cancer screening. - Schedule Pap smear in approximately, before end of 2025 - Plan for complete physical exam in the spring.      Iron deficiency anemia   Iron Deficiency Anemia Chronic Iron deficiency anemia with previous blood transfusion. Currently not taking iron supplements as she has not felt the need in the past year and a half. - Order CBC to assess current iron levels.      Relevant Orders   CBC  w/Diff/Platelet   Irregular menses   Likely related to perimenopausal symptoms  CTM  Recommended menopause supplements      Perimenopausal vasomotor symptoms - Primary   Perimenopausal Symptoms Chronic Experiencing perimenopausal symptoms including irregular menstruation and hot flashes. Symptoms are not severely impacting quality of life. Family history of early menopause due to hysterectomy in mother and grandmother. Discussed typical perimenopausal symptoms and management strategies. - Consider menopause-focused supplements, ensuring liver function is monitored. - Order TSH and T4 to rule out thyroid  dysfunction. - Encourage conservative management of symptoms.      Relevant Orders   Comprehensive Metabolic Panel (CMET)   Hemoglobin A1c   TSH + free T4   Vertigo   Intermittent problem  No symptoms today  CTM for symptoms  Discussed Epley maneuver if symptoms reoccur       Other Visit Diagnoses       Encounter for screening mammogram for malignant neoplasm of breast       Relevant Orders   MM 3D SCREENING MAMMOGRAM BILATERAL BREAST     Screening for colon cancer  Relevant Orders   Ambulatory referral to Gastroenterology        Assessment & Plan             Return in about 6 weeks (around 11/29/2023) for Pap.      Rockie Agent, MD  [facility] [phone] (phone) [phone] (fax)  [facility]

## 2023-10-18 NOTE — Assessment & Plan Note (Signed)
 Intermittent problem  No symptoms today  CTM for symptoms  Discussed Epley maneuver if symptoms reoccur

## 2023-10-18 NOTE — Assessment & Plan Note (Signed)
 Iron Deficiency Anemia Chronic Iron deficiency anemia with previous blood transfusion. Currently not taking iron supplements as she has not felt the need in the past year and a half. - Order CBC to assess current iron levels.

## 2023-10-18 NOTE — Assessment & Plan Note (Signed)
 Class 3 Obesity, Chronic condition  Class 3 obesity with a BMI of 53. Motivated to lose weight naturally without medications or surgery. Previously lost 20 pounds through intermittent fasting and dietary changes. Current weight is 324 pounds. Aims to lose 120 pounds. Engages in walking and is mindful of diet, but desires to increase physical activity. - Encourage continuation of intermittent fasting and low-carb diet. - Advise increasing physical activity to 200-240 minutes per week of moderate intensity exercise. - Recommend aiming for 150 grams of protein intake daily. - Schedule follow-up in 6 months for weight management and lifestyle changes.

## 2023-10-18 NOTE — Assessment & Plan Note (Signed)
 Likely related to perimenopausal symptoms  CTM  Recommended menopause supplements

## 2023-10-18 NOTE — Assessment & Plan Note (Signed)
 Perimenopausal Symptoms Chronic Experiencing perimenopausal symptoms including irregular menstruation and hot flashes. Symptoms are not severely impacting quality of life. Family history of early menopause due to hysterectomy in mother and grandmother. Discussed typical perimenopausal symptoms and management strategies. - Consider menopause-focused supplements, ensuring liver function is monitored. - Order TSH and T4 to rule out thyroid  dysfunction. - Encourage conservative management of symptoms.

## 2023-10-19 LAB — CBC WITH DIFFERENTIAL/PLATELET
Basophils Absolute: 0.1 x10E3/uL (ref 0.0–0.2)
Basos: 1 %
EOS (ABSOLUTE): 0.1 x10E3/uL (ref 0.0–0.4)
Eos: 2 %
Hematocrit: 34.2 % (ref 34.0–46.6)
Hemoglobin: 10.4 g/dL — ABNORMAL LOW (ref 11.1–15.9)
Immature Grans (Abs): 0 x10E3/uL (ref 0.0–0.1)
Immature Granulocytes: 0 %
Lymphocytes Absolute: 2.7 x10E3/uL (ref 0.7–3.1)
Lymphs: 34 %
MCH: 27 pg (ref 26.6–33.0)
MCHC: 30.4 g/dL — ABNORMAL LOW (ref 31.5–35.7)
MCV: 89 fL (ref 79–97)
Monocytes Absolute: 0.7 x10E3/uL (ref 0.1–0.9)
Monocytes: 8 %
Neutrophils Absolute: 4.3 x10E3/uL (ref 1.4–7.0)
Neutrophils: 55 %
Platelets: 339 x10E3/uL (ref 150–450)
RBC: 3.85 x10E6/uL (ref 3.77–5.28)
RDW: 13.2 % (ref 11.7–15.4)
WBC: 7.9 x10E3/uL (ref 3.4–10.8)

## 2023-10-19 LAB — HEMOGLOBIN A1C
Est. average glucose Bld gHb Est-mCnc: 88 mg/dL
Hgb A1c MFr Bld: 4.7 % — ABNORMAL LOW (ref 4.8–5.6)

## 2023-10-19 LAB — COMPREHENSIVE METABOLIC PANEL WITH GFR
ALT: 10 IU/L (ref 0–32)
AST: 17 IU/L (ref 0–40)
Albumin: 3.9 g/dL (ref 3.9–4.9)
Alkaline Phosphatase: 69 IU/L (ref 41–116)
BUN/Creatinine Ratio: 11 (ref 9–23)
BUN: 9 mg/dL (ref 6–24)
Bilirubin Total: 0.7 mg/dL (ref 0.0–1.2)
CO2: 24 mmol/L (ref 20–29)
Calcium: 8.9 mg/dL (ref 8.7–10.2)
Chloride: 104 mmol/L (ref 96–106)
Creatinine, Ser: 0.82 mg/dL (ref 0.57–1.00)
Globulin, Total: 3 g/dL (ref 1.5–4.5)
Glucose: 83 mg/dL (ref 70–99)
Potassium: 4.3 mmol/L (ref 3.5–5.2)
Sodium: 141 mmol/L (ref 134–144)
Total Protein: 6.9 g/dL (ref 6.0–8.5)
eGFR: 90 mL/min/1.73 (ref >59)

## 2023-10-19 LAB — LIPID PANEL
Chol/HDL Ratio: 2.7 ratio (ref 0.0–4.4)
Cholesterol, Total: 185 mg/dL (ref 100–199)
HDL: 69 mg/dL (ref >39)
LDL Chol Calc (NIH): 104 mg/dL — ABNORMAL HIGH (ref 0–99)
Triglycerides: 66 mg/dL (ref 0–149)
VLDL Cholesterol Cal: 12 mg/dL (ref 5–40)

## 2023-10-19 LAB — TSH+FREE T4
Free T4: 1.17 ng/dL (ref 0.82–1.77)
TSH: 1.83 u[IU]/mL (ref 0.450–4.500)

## 2023-11-05 ENCOUNTER — Ambulatory Visit: Payer: Self-pay | Admitting: Family Medicine

## 2023-12-13 ENCOUNTER — Ambulatory Visit: Admitting: Family Medicine

## 2024-01-19 ENCOUNTER — Telehealth: Payer: Self-pay

## 2024-01-19 ENCOUNTER — Other Ambulatory Visit: Payer: Self-pay

## 2024-01-19 DIAGNOSIS — Z1211 Encounter for screening for malignant neoplasm of colon: Secondary | ICD-10-CM

## 2024-01-19 MED ORDER — NA SULFATE-K SULFATE-MG SULF 17.5-3.13-1.6 GM/177ML PO SOLN: 1.0000 | Freq: Once | ORAL | 0 refills | Status: AC

## 2024-01-19 NOTE — Telephone Encounter (Signed)
 Gastroenterology Pre-Procedure Review  Request Date: 05/03/24 Requesting Physician: Dr. Jinny  PATIENT REVIEW QUESTIONS: The patient responded to the following health history questions as indicated:    1. Are you having any GI issues? no 2. Do you have a personal history of Polyps? no 3. Do you have a family history of Colon Cancer or Polyps? Mother colon polyp 4. Diabetes Mellitus? no 5. Joint replacements in the past 12 months?no 6. Major health problems in the past 3 months?no 7. Any artificial heart valves, MVP, or defibrillator?no    MEDICATIONS & ALLERGIES:    Patient reports the following regarding taking any anticoagulation/antiplatelet therapy:   Plavix, Coumadin, Eliquis, Xarelto, Lovenox, Pradaxa, Brilinta, or Effient? no Aspirin? no  Patient confirms/reports the following medications:  Current Outpatient Medications  Medication Sig Dispense Refill   Na Sulfate-K Sulfate-Mg Sulfate concentrate (SUPREP) 17.5-3.13-1.6 GM/177ML SOLN Take 1 kit (354 mLs total) by mouth once for 1 dose. 354 mL 0   FERROUS SULFATE  PO Take 28 mg by mouth at bedtime. (Patient not taking: Reported on 10/18/2023)     No current facility-administered medications for this visit.    Patient confirms/reports the following allergies:  Allergies[1]  No orders of the defined types were placed in this encounter.   AUTHORIZATION INFORMATION Primary Insurance: 1D#: Group #:  Secondary Insurance: 1D#: Group #:  SCHEDULE INFORMATION: Date: 05/03/24 Time: Location: [facility]    [1] No Known Allergies

## 2024-04-19 ENCOUNTER — Encounter: Admitting: Family Medicine

## 2024-05-03 ENCOUNTER — Ambulatory Visit: Admit: 2024-05-03 | Admitting: Gastroenterology

## 2024-05-03 SURGERY — COLONOSCOPY
Anesthesia: General
